# Patient Record
Sex: Female | Born: 1955 | Race: White | Hispanic: No | Smoking: Never smoker
Health system: Southern US, Community
[De-identification: ages and names within clinical notes are randomized; demographics above are authoritative.]

## PROBLEM LIST (undated history)

## (undated) DIAGNOSIS — I10 Essential (primary) hypertension: Secondary | ICD-10-CM

## (undated) DIAGNOSIS — L409 Psoriasis, unspecified: Secondary | ICD-10-CM

## (undated) DIAGNOSIS — E119 Type 2 diabetes mellitus without complications: Secondary | ICD-10-CM

## (undated) HISTORY — PX: ABDOMINAL HYSTERECTOMY: SHX81

## (undated) HISTORY — DX: Type 2 diabetes mellitus without complications: E11.9

## (undated) HISTORY — PX: TONSILLECTOMY: SUR1361

## (undated) HISTORY — PX: APPENDECTOMY: SHX54

## (undated) HISTORY — PX: EYE SURGERY: SHX253

## (undated) HISTORY — PX: TUBAL LIGATION: SHX77

---

## 1999-09-16 ENCOUNTER — Ambulatory Visit (HOSPITAL_COMMUNITY): Admission: RE | Admit: 1999-09-16 | Discharge: 1999-09-16 | Payer: Self-pay | Admitting: Cardiology

## 1999-11-09 ENCOUNTER — Encounter: Payer: Self-pay | Admitting: Internal Medicine

## 1999-11-09 ENCOUNTER — Encounter: Admission: RE | Admit: 1999-11-09 | Discharge: 1999-11-09 | Payer: Self-pay | Admitting: Internal Medicine

## 2006-05-24 ENCOUNTER — Ambulatory Visit (HOSPITAL_COMMUNITY): Admission: RE | Admit: 2006-05-24 | Discharge: 2006-05-24 | Payer: Self-pay | Admitting: Internal Medicine

## 2006-05-24 ENCOUNTER — Ambulatory Visit: Payer: Self-pay | Admitting: Internal Medicine

## 2006-10-13 ENCOUNTER — Ambulatory Visit: Payer: Self-pay | Admitting: Internal Medicine

## 2006-10-17 ENCOUNTER — Ambulatory Visit: Payer: Self-pay | Admitting: Internal Medicine

## 2006-10-17 LAB — CONVERTED CEMR LAB
Basophils Absolute: 0 10*3/uL (ref 0.0–0.1)
Basophils Relative: 0.5 % (ref 0.0–1.0)
Chol/HDL Ratio, serum: 5.2
Cholesterol: 152 mg/dL (ref 0–200)
Eosinophil percent: 1.9 % (ref 0.0–5.0)
Free T4: 0.8 ng/dL — ABNORMAL LOW (ref 0.9–1.8)
HCT: 37.1 % (ref 36.0–46.0)
HDL: 29.5 mg/dL — ABNORMAL LOW (ref >39.0)
Hemoglobin: 12.6 g/dL (ref 12.0–15.0)
Hgb A1c MFr Bld: 5 % (ref 4.6–6.0)
LDL Cholesterol: 107 mg/dL — ABNORMAL HIGH (ref 0–99)
Lymphocytes Relative: 27.5 % (ref 12.0–46.0)
MCHC: 34 g/dL (ref 30.0–36.0)
MCV: 92.3 fL (ref 78.0–100.0)
Monocytes Absolute: 0.3 10*3/uL (ref 0.2–0.7)
Monocytes Relative: 5.5 % (ref 3.0–11.0)
Neutro Abs: 3.7 10*3/uL (ref 1.4–7.7)
Neutrophils Relative %: 64.6 % (ref 43.0–77.0)
Platelets: 232 10*3/uL (ref 150–400)
RBC: 4.02 M/uL (ref 3.87–5.11)
RDW: 13.4 % (ref 11.5–14.6)
TSH: 3.15 microintl units/mL (ref 0.35–5.50)
Triglyceride fasting, serum: 79 mg/dL (ref 0–149)
VLDL: 16 mg/dL (ref 0–40)
WBC: 5.6 10*3/uL (ref 4.5–10.5)

## 2006-11-01 ENCOUNTER — Ambulatory Visit: Payer: Self-pay | Admitting: Internal Medicine

## 2007-08-16 ENCOUNTER — Ambulatory Visit: Payer: Self-pay | Admitting: Internal Medicine

## 2007-08-16 DIAGNOSIS — J45909 Unspecified asthma, uncomplicated: Secondary | ICD-10-CM | POA: Insufficient documentation

## 2007-08-17 ENCOUNTER — Telehealth (INDEPENDENT_AMBULATORY_CARE_PROVIDER_SITE_OTHER): Payer: Self-pay | Admitting: *Deleted

## 2007-08-17 ENCOUNTER — Ambulatory Visit: Payer: Self-pay | Admitting: Internal Medicine

## 2007-08-21 ENCOUNTER — Telehealth (INDEPENDENT_AMBULATORY_CARE_PROVIDER_SITE_OTHER): Payer: Self-pay | Admitting: *Deleted

## 2007-08-21 ENCOUNTER — Encounter (INDEPENDENT_AMBULATORY_CARE_PROVIDER_SITE_OTHER): Payer: Self-pay | Admitting: *Deleted

## 2007-08-24 ENCOUNTER — Ambulatory Visit: Payer: Self-pay | Admitting: Internal Medicine

## 2007-08-30 ENCOUNTER — Ambulatory Visit: Payer: Self-pay | Admitting: Internal Medicine

## 2007-08-31 ENCOUNTER — Telehealth: Payer: Self-pay | Admitting: Internal Medicine

## 2007-09-01 ENCOUNTER — Encounter: Payer: Self-pay | Admitting: Internal Medicine

## 2007-09-10 ENCOUNTER — Ambulatory Visit: Payer: Self-pay | Admitting: Critical Care Medicine

## 2007-09-10 LAB — CONVERTED CEMR LAB: IgE (Immunoglobulin E), Serum: 6.4 intl units/mL (ref 0.0–180.0)

## 2007-09-13 ENCOUNTER — Telehealth: Payer: Self-pay | Admitting: Internal Medicine

## 2007-09-19 ENCOUNTER — Encounter: Payer: Self-pay | Admitting: Critical Care Medicine

## 2007-09-19 ENCOUNTER — Ambulatory Visit: Admission: RE | Admit: 2007-09-19 | Discharge: 2007-09-19 | Payer: Self-pay | Admitting: Critical Care Medicine

## 2007-09-19 ENCOUNTER — Ambulatory Visit: Payer: Self-pay | Admitting: Critical Care Medicine

## 2007-09-27 ENCOUNTER — Ambulatory Visit: Payer: Self-pay | Admitting: Critical Care Medicine

## 2007-10-08 ENCOUNTER — Ambulatory Visit: Payer: Self-pay | Admitting: Critical Care Medicine

## 2007-10-22 DIAGNOSIS — J8410 Pulmonary fibrosis, unspecified: Secondary | ICD-10-CM | POA: Insufficient documentation

## 2007-11-30 ENCOUNTER — Ambulatory Visit: Payer: Self-pay | Admitting: Critical Care Medicine

## 2008-03-20 ENCOUNTER — Ambulatory Visit: Payer: Self-pay | Admitting: Internal Medicine

## 2008-10-07 ENCOUNTER — Ambulatory Visit: Payer: Self-pay | Admitting: Internal Medicine

## 2008-11-02 IMAGING — CR DG CHEST 2V
2 series · 2 of 2 positions shown · non-contrast
Comparison: 05/24/2006.

CLINICAL DATA: Fever. Pneumonia. Asthma. Cough. Nonsmoker.

CHEST - 2 VIEW

[view not recorded (1 of 2)]
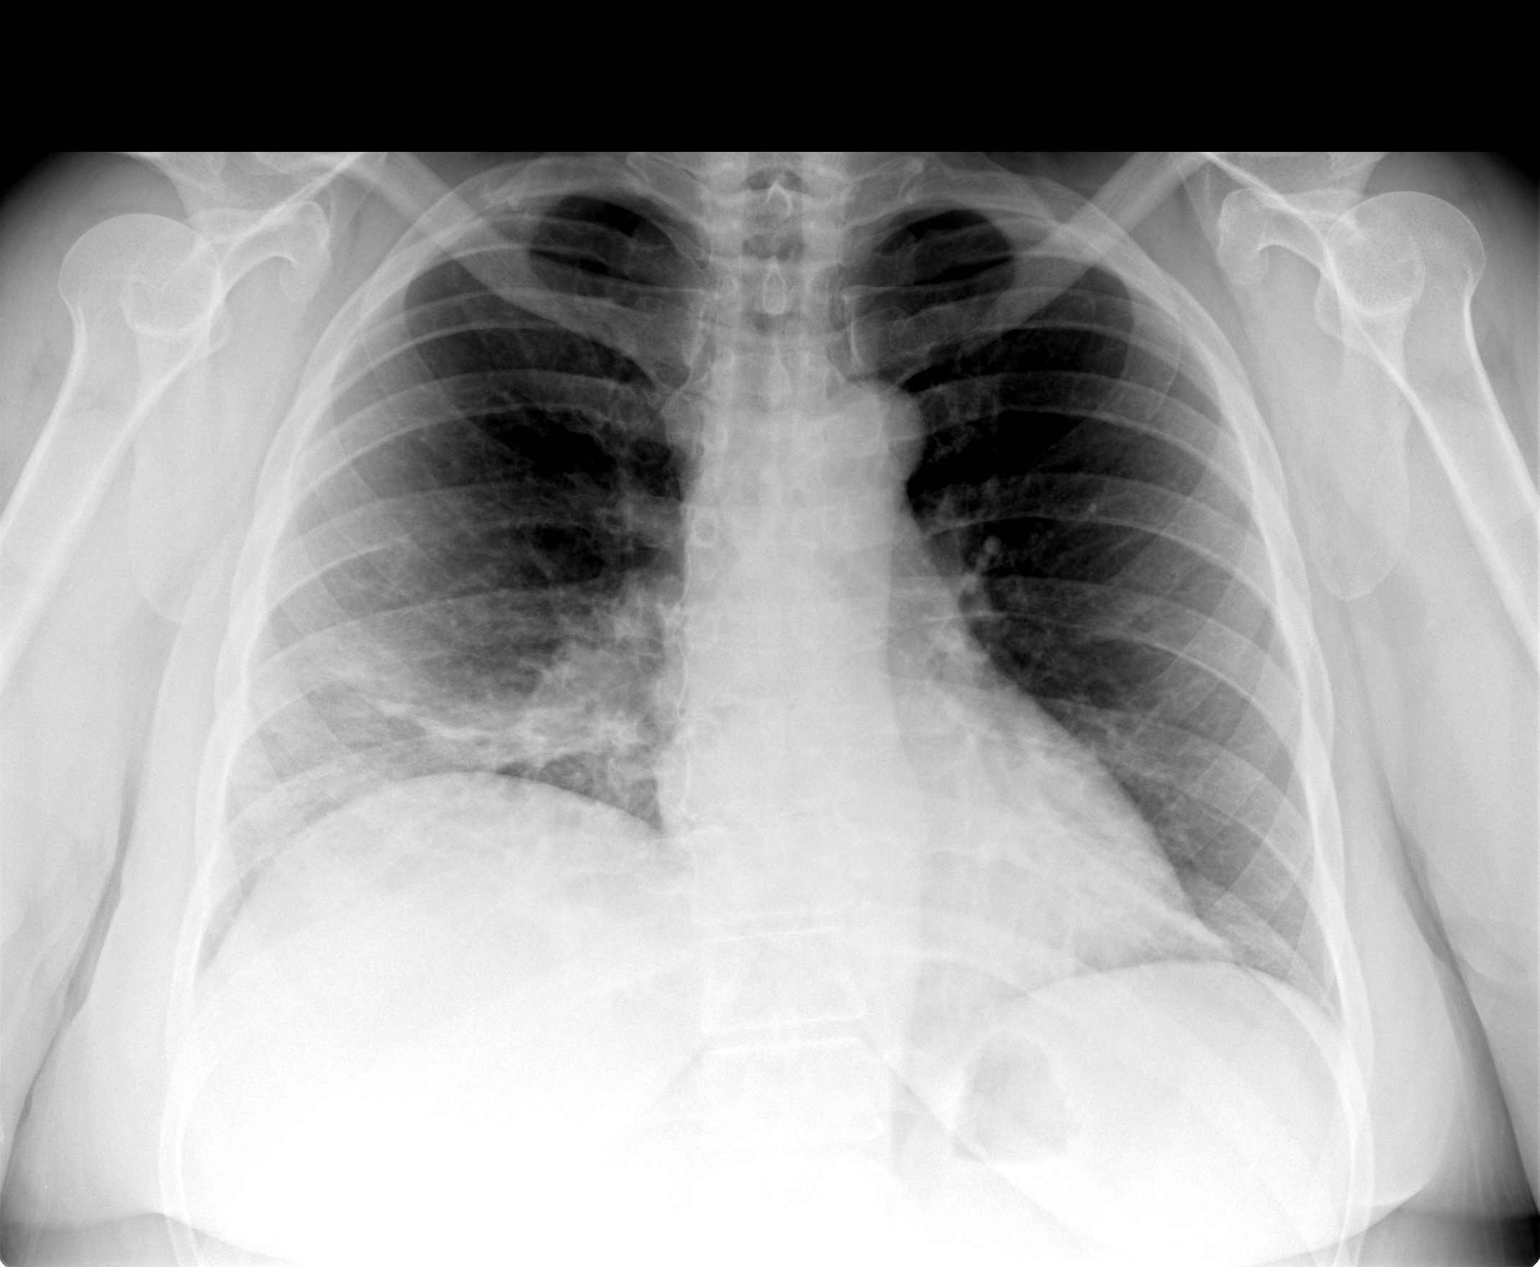

[view not recorded (2 of 2)]
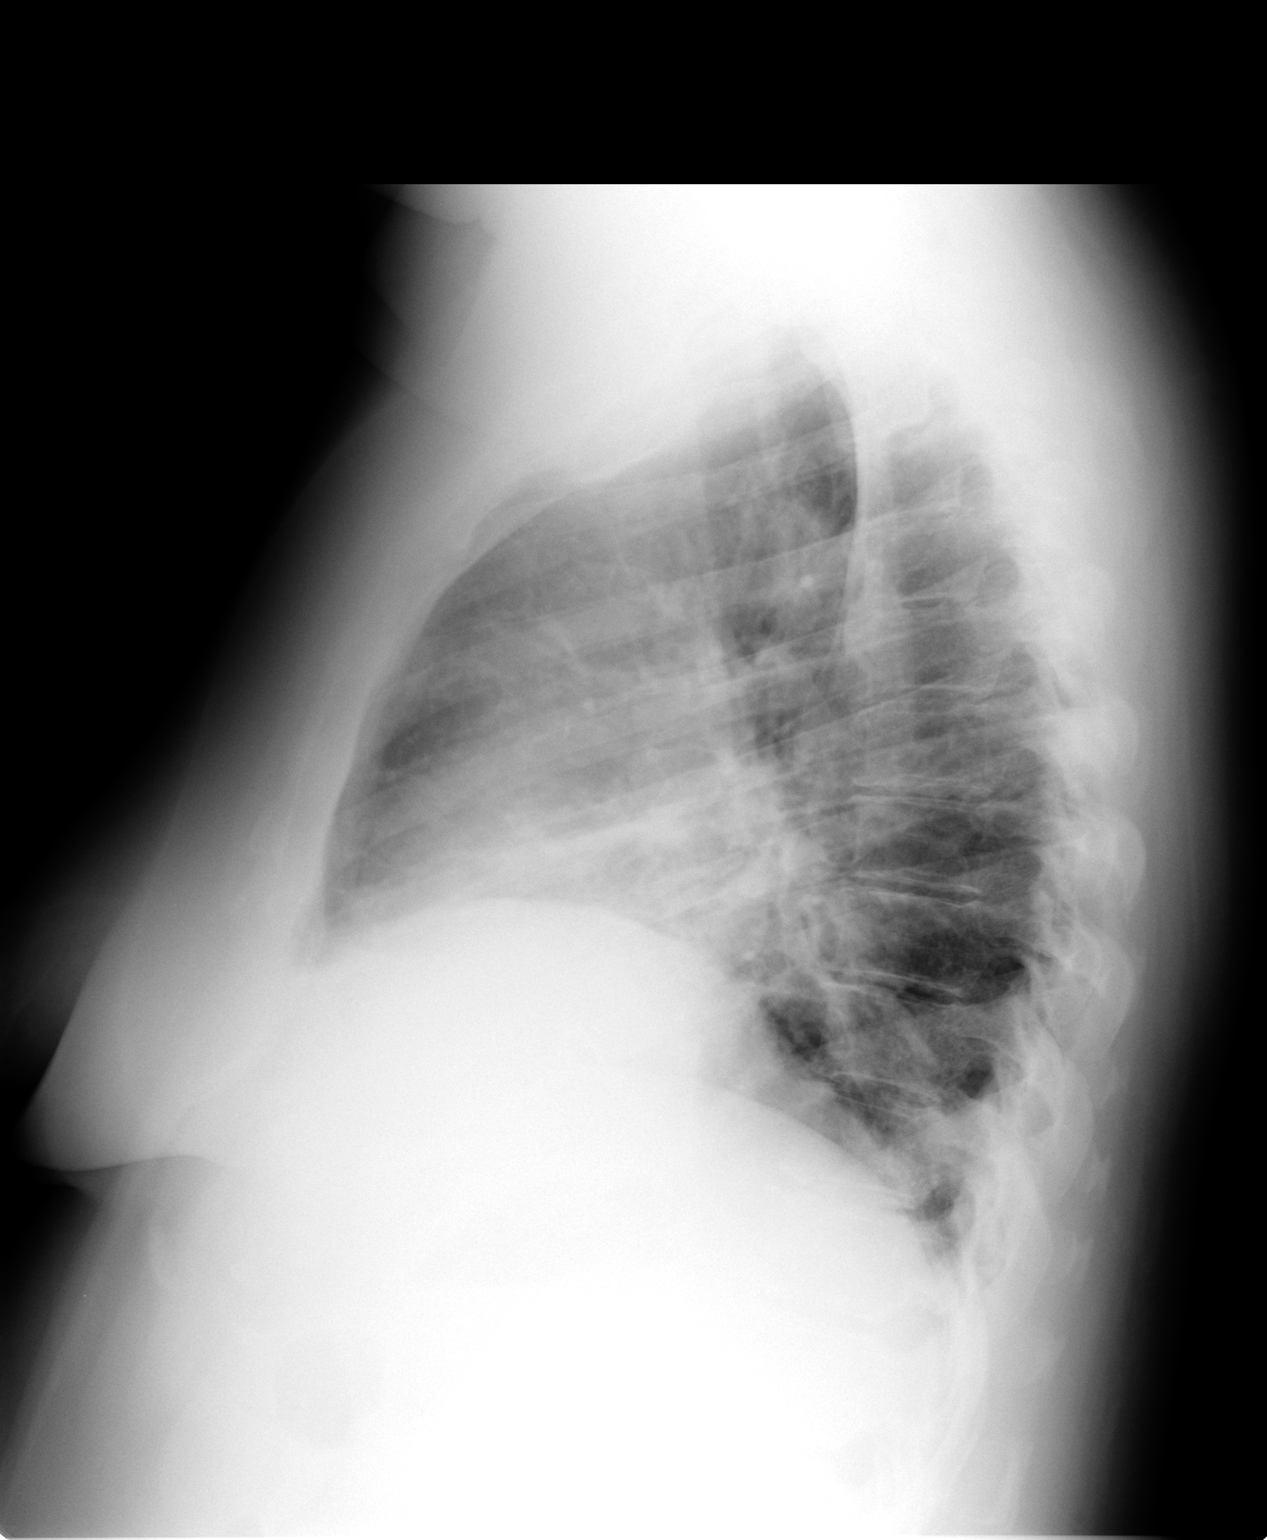

[2 of 2 positions shown; findings below may reference images not displayed]

FINDINGS: Midline trachea. Normal heart size. Right hilar soft tissue fullness.

Sharp costophrenic angles without pneumothorax. New volume loss and airspace
disease in the right infrahilar lung, likely right lower lobe. Left lung clear.

IMPRESSION

1. Right infrahilar volume loss and air space opacity with a suggestion of right
hilar soft tissue fullness. Although findings could relate to infection, a
underlying central right hilar lesion with postobstructive atelectasis and/or
pneumonitis is suspected. Further evaluation with chest CT should be considered.
Alternatively, patient could be treated for infection with short-term followup
radiographs in approximately one week performed.  I called and personally
discussed this report with Dr. Rtoyota , at [DATE] a.m. on 08/17/2007 .

## 2008-11-03 ENCOUNTER — Telehealth (INDEPENDENT_AMBULATORY_CARE_PROVIDER_SITE_OTHER): Payer: Self-pay | Admitting: *Deleted

## 2008-11-06 ENCOUNTER — Ambulatory Visit: Payer: Self-pay | Admitting: Internal Medicine

## 2008-11-06 DIAGNOSIS — G43009 Migraine without aura, not intractable, without status migrainosus: Secondary | ICD-10-CM | POA: Insufficient documentation

## 2008-11-06 DIAGNOSIS — I10 Essential (primary) hypertension: Secondary | ICD-10-CM | POA: Insufficient documentation

## 2008-11-06 DIAGNOSIS — F411 Generalized anxiety disorder: Secondary | ICD-10-CM | POA: Insufficient documentation

## 2011-01-18 NOTE — Assessment & Plan Note (Signed)
Summary: TRANSFER FROM DR. HOPPER/$50/SELF PAY/JSS   Vital Signs:  Patient Profile:   55 Years Old Female Height:     68 inches Weight:      281 pounds O2 Sat:      94 % O2 treatment:    Room Air Temp:     97.6 degrees F oral Pulse rate:   90 / minute BP sitting:   134 / 98  (left arm) Cuff size:   large  Vitals Entered By: Payton Spark CMA (November 06, 2008 9:51 AM)                  Chief Complaint:  new pt.  History of Present Illness: transferring care here she states b/c driving to [place] with dr hopper is too far; no insurance - she is self Engineer, materials for Dept of soc services -  BP mildly elevated today, has been that was recent after tylenol cold prep; was afraid to take the diltiazem per dr hopper b/c of the dire warnings she read on the info that comes with the med from the pharmacy; is instead on maxide for 4th day today and having some headache and palpitations that she does not normally  have with coffee; also with some dry cough for 1 wk - tx for bronchitis per pt approx 4 wks ago - tx with codein cough prep but not really taking; has been reading on the internet about BOOP since that was suggested at some time in the past and is still not sure what her problem was as diagnosed per dr Delford Field, now with having some trouble getting health insurance she says b/c "not sure of what her diganosis was"; cont's to have nervousness it seems with remarkably poor insight, No insurance at this time; also never took the gabapentin per dr hopper due to suicide warning on the label; used to take midrin and darvocet for migraine    Updated Prior Medication List: * EXCEDRIN  SYMBICORT 160-4.5 MCG/ACT  AERO (BUDESONIDE-FORMOTEROL FUMARATE) two puff two times a day [BMN] HYDROCHLOROTHIAZIDE 25 MG TABS (HYDROCHLOROTHIAZIDE) 1po once daily KLOR-CON 10 10 MEQ CR-TABS (POTASSIUM CHLORIDE) 1 by mouth once daily AMLODIPINE BESYLATE 5 MG TABS (AMLODIPINE BESYLATE) 1po once  daily MIDRIN 325-65-100 MG CAPS (APAP-ISOMETHEPTENE-DICHLORAL) 1po once daily as needed DARVOCET-N 100 100-650 MG TABS (PROPOXYPHENE N-APAP) 1 by mouth once daily as needed headache PREDNISONE 10 MG TABS (PREDNISONE) 3po qd for 3days, then 2po qd for 3days, then 1po qd for 3days, then stop PROAIR HFA 108 (90 BASE) MCG/ACT AERS (ALBUTEROL SULFATE) 2 puffs four times per day as needed  Current Allergies (reviewed today): ! PCN ! 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)       CHEST XRAY, ABNORMAL (ICD-793.1)       PNEUMONIA (ICD-486) 2008       Allergic rhinitis       Anxiety       migraine       Hypertension       DJD - bilat knees  Past Surgical History:    Reviewed history from 10/07/2008 and no changes required:       Cystoscopy  Appendectomy       Tonsillectomy       Hysterectomy       Bronchoscopy 2008, Dr Delford Field,  Bronchiolitis Obliterans post CAP , hypersensitivity airway inflammation       Tubal ligation       s/p left eye surgury 1975   Family History:    Reviewed history from 08/16/2007 and no changes required:       Father:BPH       Mother:asthma , COAD, DM        Siblings:bro DM,ETOH   Social History:    Reviewed history and no changes required:       former Manufacturing systems engineer       Divorced       2 children       work - self employed Pensions consultant  - member of [employer]       daughter is Librarian, academic       Never Smoked       Alcohol use-no   Risk Factors:  Tobacco use:  never Alcohol use:  no   Review of Systems       all otherwise negative    Physical Exam  General:     alert and overweight-appearing.   Head:     Normocephalic and atraumatic without obvious abnormalities. No apparent alopecia or  balding. Eyes:     No corneal or conjunctival inflammation noted. EOMI. Perrla.  Ears:     External ear exam shows no significant lesions or deformities.  Otoscopic examination reveals clear canals, tympanic membranes are intact bilaterally without bulging, retraction, inflammation or discharge. Hearing is grossly normal bilaterally. Nose:     External nasal examination shows no deformity or inflammation. Nasal mucosa are pink and moist without lesions or exudates. Mouth:     Oral mucosa and oropharynx without lesions or exudates.  Teeth in good repair. Neck:     No deformities, masses, or tenderness noted. Lungs:     Normal respiratory effort, chest expands symmetrically. Lungs are clear to auscultation, no crackles or wheezes. Heart:     Normal rate and regular rhythm. S1 and S2 normal without gallop, murmur, click, rub or other extra sounds. Abdomen:     Bowel sounds positive,abdomen soft and non-tender without masses, organomegaly or hernias noted. Msk:     no joint tenderness and no joint swelling.   Extremities:     no edema, no ulcers  Neurologic:     cranial nerves II-XII intact and strength normal in all extremities.   Psych:     severely anxious.      Impression & Recommendations:  Problem # 1:  ASTHMA (ICD-493.90) ? by hx more c/w chronic asthmatic bronchitis  - treat as above, f/u any worsening signs or symptoms , will refer back to pulm (dr Delford Field) even though she just transferred from dr hopper who is also pulmonologist) Her updated medication list for this problem includes:    Symbicort 160-4.5 Mcg/act Aero (Budesonide-formoterol fumarate) .Marland Kitchen..Marland Kitchen Two puff two times a day    Prednisone 10 Mg Tabs (Prednisone) .Marland Kitchen... 3po qd for 3days, then 2po qd for 3days, then 1po qd for 3days, then stop    Proair Hfa 108 (90 Base) Mcg/act Aers (Albuterol sulfate) .Marland Kitchen... 2 puffs four times per day as needed  Orders: T-2 View CXR, Same Day (71020.5TC) Pulmonary Referral  (Pulmonary)   Problem # 2:  HYPERTENSION (ICD-401.9)  Her updated medication list for this problem includes:  Hydrochlorothiazide 25 Mg Tabs (Hydrochlorothiazide) .Marland Kitchen... 1po once daily    Amlodipine Besylate 5 Mg Tabs (Amlodipine besylate) .Marland Kitchen... 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 1po once daily as needed    Darvocet-n 100 100-650 Mg Tabs (Propoxyphene n-apap) .Marland Kitchen... 1 by mouth once daily as needed headache treat as above, f/u any worsening signs or symptoms   Problem # 4:  ANXIETY (ICD-300.00) reasured today on multiple issues, she seems surprised and taken aback at my suggestion she may have more anxiety on a chronic basis   Complete Medication List: 1)  Excedrin  2)  Symbicort 160-4.5 Mcg/act Aero (Budesonide-formoterol fumarate) .... Two puff two times a day 3)  Hydrochlorothiazide 25 Mg Tabs (Hydrochlorothiazide) .Marland Kitchen.. 1po once daily 4)  Klor-con 10 10 Meq Cr-tabs (Potassium chloride) .Marland Kitchen.. 1 by mouth once daily 5)  Amlodipine Besylate 5 Mg Tabs (Amlodipine besylate) .Marland Kitchen.. 1po once daily 6)  Midrin 325-65-100 Mg Caps (Apap-isometheptene-dichloral) .Marland Kitchen.. 1po once daily as needed 7)  Darvocet-n 100 100-650 Mg Tabs (Propoxyphene n-apap) .Marland Kitchen.. 1 by mouth once daily as needed headache 8)  Prednisone 10 Mg Tabs (Prednisone) .... 3po qd for 3days, then 2po qd for 3days, then 1po qd for 3days, then stop 9)  Proair Hfa 108 (90  Base) Mcg/act Aers (Albuterol sulfate) .... 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   Prescriptions: PROAIR HFA 108 (90 BASE) MCG/ACT AERS (ALBUTEROL SULFATE) 2 puffs four times per day as needed  #1 x 11   Entered and Authorized by:   Corwin Levins MD   Signed by:   Corwin Levins MD on 11/06/2008   Method used:   Print then Give to Patient   RxID:   [id number] SYMBICORT 160-4.5 MCG/ACT  AERO (BUDESONIDE-FORMOTEROL FUMARATE) two puff two times a day Brand medically necessary #1 x 11   Entered and Authorized by:   Corwin Levins MD   Signed by:   Corwin Levins MD on 11/06/2008   Method used:   Print then Give to Patient   RxID:   [id number] PREDNISONE 10 MG TABS (PREDNISONE) 3po qd for 3days, then 2po qd for 3days, then 1po qd for 3days, then stop  #18 x 0   Entered and Authorized by:   Corwin Levins MD   Signed by:   Corwin Levins MD on 11/06/2008  Method used:   Print then Give to Patient   RxID:   [id number] DARVOCET-N 100 100-650 MG TABS (PROPOXYPHENE N-APAP) 1 by mouth once daily as needed headache  #30 x 1   Entered and Authorized by:   Corwin Levins MD   Signed by:   Corwin Levins MD on 11/06/2008   Method used:   Print then Give to Patient   RxID:   [id number] MIDRIN 325-65-100 MG CAPS (APAP-ISOMETHEPTENE-DICHLORAL) 1po once daily as needed  #30 x 1   Entered and Authorized by:   Corwin Levins MD   Signed by:   Corwin Levins MD on  11/06/2008   Method used:   Print then Give to Patient   RxID:   [id number] AMLODIPINE BESYLATE 5 MG TABS (AMLODIPINE BESYLATE) 1po once daily  #30 x 11   Entered and Authorized by:   Corwin Levins MD   Signed by:   Corwin Levins MD on 11/06/2008   Method used:   Print then Give to Patient   RxID:   [id number] KLOR-CON 10 10 MEQ CR-TABS (POTASSIUM CHLORIDE) 1 by mouth once daily  #30 x 11   Entered and Authorized by:   Corwin Levins MD   Signed by:   Corwin Levins MD on 11/06/2008   Method used:   Print then Give to Patient   RxID:   [id number] HYDROCHLOROTHIAZIDE 25 MG TABS (HYDROCHLOROTHIAZIDE) 1po once daily  #100 x 3   Entered and Authorized by:   Corwin Levins MD   Signed by:   Corwin Levins MD on 11/06/2008   Method used:   Print then Give to Patient   RxID:   [id number]  ]

## 2011-01-18 NOTE — Letter (Signed)
Summary: Results Follow up Letter  [facility]  [address] [place], [place] [address]   Phone: [phone]  Fax: [phone]    08/21/2007 MRN: [id number]  HARLA MENSCH [address] [place], [place]  [address]  Dear Ms. Pruiett,  The following are the results of your recent test(s):  Test         Result    Pap Smear:        Normal _____  Not Normal _____ Comments: ______________________________________________________ Cholesterol: LDL(Bad cholesterol):         Your goal is less than:         HDL (Good cholesterol):       Your goal is more than: Comments:  ______________________________________________________ Mammogram:        Normal _____  Not Normal _____ Comments:  ___________________________________________________________________ Hemoccult:        Normal _____  Not normal _______ Comments:    _____________________________________________________________________ Other Tests:  Please see attached results and comments   We routinely do not discuss normal results over the telephone.  If you desire a copy of the results, or you have any questions about this information we can discuss them at your next office visit.   Sincerely,

## 2011-01-18 NOTE — Miscellaneous (Signed)
Summary: Orders Update  Clinical Lists Changes  Orders: Added new Referral order of Pulmonary Referral (Pulmonary) - Signed 

## 2011-01-18 NOTE — Assessment & Plan Note (Signed)
Summary: 2 MONTHS FU///KWP   Chief Complaint:  no problems and asthma.  History of Present Illness:       This is a 55 years old female who presents with asthma.  The patient complains of history of diagnosed Asthma, chest tightness, chest pain, and nocturnal awakening, but denies cough, shortness of breath, wheezing, mucous production, and congestion.  Symptoms appear triggered by irritant:.  The patient also has the following associated problems: heartburn, sour taste in mouth, indigestion, rash, and non-productive cough.  Previous effective treatment includes ICS + LABA.    The patient also notes a rash over both feet and left hand.  The patient overall is less dyspneic.  She stopped symbicort three weeks ago as she could not affordt the medicine.  She is getting patient assistance.     Current Allergies: ! PCN ! 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  late 1990s   Family History:    Reviewed history from 08/16/2007 and no changes required:       Father:BPH       Mother:asthma , COAD,DM        Siblings:bro DM,ETOH    Risk Factors: Tobacco use:  never Alcohol use:  no   Review of Systems      See HPI for Pulmonary, Cardiac, General, and ENT review of systems.  General      Denies fever, chills, and sweats.  ENT      Denies nasal congestion, nosebleeds, difficulty swallowing, and post nasal drip.  CV      Denies swelling of hands or feet.   Vital Signs:  Patient Profile:   55 Years Old Female Height:     68 inches Weight:      294.6 pounds BMI:     44.96 O2 Sat:      95 % Temp:     99.1 degrees F oral Pulse rate:   81 / minute BP standing:   130 / 80  (left arm)  Pt. in pain?   no  Vitals Entered By: Clarise Cruz Duncan Dull) (November 30, 2007 4:14 PM) Oxygen therapy Room Air                  Physical Exam  General:     well developed, well  nourished, in no acute distress Head:     normocephalic and atraumatic Nose:     no deformity, discharge, inflammation, or lesions Mouth:     no deformity or lesions Neck:     no masses, thyromegaly, or abnormal cervical nodes Lungs:     clear bilaterally to auscultation and percussion Heart:     regular rate and rhythm, S1, S2 without murmurs, rubs, gallops, or clicks Abdomen:     bowel sounds positive; abdomen soft and non-tender without masses, or organomegaly Pulses:     pulses normal Extremities:     no clubbing, cyanosis, edema, or deformity noted Skin:     eczematous rash:.  over both dorsum of the feet; also on hands as well Cervical Nodes:     no significant adenopathy Axillary Nodes:     no significant adenopathy     Problem # 1:  ASTHMA (ICD-493.90) Assessment: Improved Stable despite off symbicort for three weeks.  plan: resume Symbicort and provide samples:  two puffs two times a day  Her updated medication list for this problem includes:    Symbicort 160-4.5 Mcg/act Aero (Budesonide-formoterol fumarate) .Marland Kitchen..Marland Kitchen Two puff two times a day   Problem # 2:  ECZEMA (ICD-692.9) Assessment: New Infected eczema of both feet and left hand  plan: Keflex 250mg  three times a day for 7days topical cortisone cream Her updated medication list for this problem includes:    Medi-cortisone 1 % Crea (Hydrocortisone acetate) .Marland Kitchen... 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 One by mouth three times a day   Medications Added to Medication List This Visit: 1)  Keflex 250 Mg Caps (Cephalexin) .... One by mouth three times a day 2)   Medi-cortisone 1 % Crea (Hydrocortisone acetate) .... Apply to affected areas three times a day  Complete Medication List: 1)  Extra Strength Tylenol  2)  Promethazine-codeine 6.25-10 Mg/38ml Syrp (Promethazine-codeine) .Marland Kitchen.. 1-2 tsp q 4-6 hrs as needed 3)  Symbicort 160-4.5 Mcg/act Aero (Budesonide-formoterol fumarate) .... Two puff two times a day 4)  Keflex 250 Mg Caps (Cephalexin) .... One by mouth three times a day 5)  Medi-cortisone 1 % Crea (Hydrocortisone acetate) .... Apply to affected areas three times a day   Patient Instructions: 1)  Please schedule a follow-up appointment in 2 months. 2)  Take Keflex for 7 days 250mg  three times a day  3)  Take topical steroid cream to affected areas three times a day    Prescriptions: SYMBICORT 160-4.5 MCG/ACT  AERO (BUDESONIDE-FORMOTEROL FUMARATE) two puff two times a day Brand medically necessary #1 x 6   Entered and Authorized by:   Storm Frisk MD   Signed by:   Storm Frisk MD on 11/30/2007   Method used:   Print then Give to Patient   RxID:   [id number] MEDI-CORTISONE 1 %  CREA (HYDROCORTISONE ACETATE) apply to affected areas three times a day  #30 gram tube x 0   Entered and Authorized by:   Storm Frisk MD   Signed by:   Storm Frisk MD on 11/30/2007   Method used:   Print then Give to Patient   RxID:   [id number] KEFLEX 250 MG  CAPS (CEPHALEXIN) one by mouth three times a day  #21 x 0   Entered and Authorized by:   Storm Frisk MD   Signed by:   Storm Frisk MD on 11/30/2007   Method used:   Print then Give to Patient   RxID:   [id number]  ]

## 2011-01-18 NOTE — Assessment & Plan Note (Signed)
Summary: acute only for bronitis//ph   Vital Signs:  Patient Profile:   55 Years Old Female Height:     68 inches Weight:      287.50 pounds Temp:     98.1 degrees F oral Resp:     17 per minute BP sitting:   150 / 110  Vitals Entered By: Kandice Hams (October 07, 2008 12:42 PM)                 Chief Complaint:  pt c/o cough with production started getting bad yesterday and Cough.  History of Present Illness: Onset as head congestion 10/03/08; productive cough as of 10/06/08. See BP ; she took Pseudoephrine  containing OTC Tylenol over weekend. PMH BP elevated in 2001; meds taken for HTN  which resolved with weight loss.  Cough      This is a 55 year old woman who presents with Cough.  The patient reports productive cough and pleuritic chest pain, but denies non-productive cough, shortness of breath, wheezing, exertional dyspnea, fever, hemoptysis, and malaise.  Associated symtpoms include cold/URI symptoms, sore throat, and nasal congestion.  The patient denies the following symptoms: chronic rhinitis, weight loss, acid reflux symptoms, and peripheral edema.  Ineffective prior treatments have included OTC cough medication.  Risk factors include recurrent sinus infections and history of allergic rhinitis. Dr Lynelle Doctor evaluation reviewed.   Hypertension History:      She complains of headache and side effects from treatment, but denies chest pain, palpitations, dyspnea with exertion, orthopnea, PND, peripheral edema, visual symptoms, neurologic problems, and syncope.  She notes the following problems with antihypertensive medication side effects: see HPI.  Further comments include: Some frontal ha with URI. PMH of Midrin for migraines.        Negative major cardiovascular risk factors include female age less than 38 years old and non-tobacco-user status.       Current Allergies: ! PCN ! IBUPROFEN  Past Medical History:    G 2P 2; Interstitial cystitis    Asthma ?  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      No aura  ENT      Complains of earache.      Pain L ear; PMH of rupture  Derm      Complains of changes in color of skin and rash.      Rx for eczema from Dr Jorja Loa  Neuro      Denies disturbances in coordination, numbness, poor balance, and tingling.      Migraines 10/13 & 10/02/08  which disrrupted work   Physical Exam  General:     well-nourished,in no acute distress; alert,appropriate and cooperative throughout examination Eyes:     No corneal or conjunctival inflammation noted. Marland Kitchen Perrla.  Ears:     R ear normal and L TM erythema.   Nose:     mucosal erythema.  Dry nose externally Mouth:     Oral mucosa and oropharynx without lesions or exudates.  Teeth in good repair. Lungs:  Normal respiratory effort, chest expands symmetrically. Lungs: bibasilar  crackles w/o  wheezes. Heart:     Normal rate and regular rhythm. S1 and S2 normal without gallop, murmur, click, rub or other extra sounds. Pulses:     R and L carotid,radial,dorsalis pedis and posterior tibial pulses are full and equal bilaterally Extremities:     No clubbing, cyanosis, edema Skin:     Eczema over hands Cervical Nodes:     No lymphadenopathy noted Axillary Nodes:     No palpable lymphadenopathy    Impression & Recommendations:  Problem # 1:  BRONCHITIS-ACUTE (ICD-466.0)  The following medications were removed from the medication list:    Promethazine-codeine 6.25-10 Mg/56ml Syrp (Promethazine-codeine) .Marland Kitchen... 1-2 tsp q 4-6 hrs as needed    Clarithromycin 500 Mg  Tb24 (Clarithromycin) .Marland Kitchen... 2 once daily with food  Her updated medication list for this problem includes:    Symbicort 160-4.5 Mcg/act Aero (Budesonide-formoterol fumarate) .Marland Kitchen..Marland Kitchen Two puff two times a day    Azithromycin 250 Mg Tabs (Azithromycin) .Marland Kitchen... As per pack   Problem # 2:  ELEVATED BLOOD PRESSURE WITHOUT DIAGNOSIS OF HYPERTENSION (ICD-796.2)  Her updated medication list for this problem includes:    Diltiazem Hcl 120 Mg Tabs (Diltiazem hcl) .Marland Kitchen... 1 once daily if bp averages > 130/85 on average   Problem # 3:  HEADACHE (ICD-784.0)  Complete Medication List: 1)  Excedrin  2)  Symbicort 160-4.5 Mcg/act Aero (Budesonide-formoterol fumarate) .... Two puff two times a day 3)  Azithromycin 250 Mg Tabs (Azithromycin) .... As per pack 4)  Gabapentin 100 Mg Caps (Gabapentin) .Marland Kitchen.. 1 every 8 hrs for migraines 5)  Diltiazem Hcl 120 Mg Tabs (Diltiazem hcl) .Marland Kitchen.. 1 once daily if bp averages > 130/85 on average  Hypertension Assessment/Plan:      The patient's hypertensive risk group is category A: No risk factors and no target organ damage.  Her calculated 10 year risk of coronary heart disease is 20 %.  Today's blood pressure is 150/110.     Patient Instructions: 1)  Check your Blood Pressure regularly. If it is above: 130/85 ON AVERAGE  you should start Ditiazem 120 mg once daily . Avoid decongestants; use a Neti pot once daily if congested.   Prescriptions: DILTIAZEM HCL 120 MG TABS (DILTIAZEM HCL) 1 once daily if BP averages > 130/85 ON AVERAGE  #30 x 5   Entered and Authorized by:   Marga Melnick MD   Signed by:   Marga Melnick MD on 10/07/2008   Method used:   Print then Give to Patient   RxID:   [id number] GABAPENTIN 100 MG CAPS (GABAPENTIN) 1 every 8 hrs for migraines  #30 x 1   Entered and Authorized by:   Marga Melnick MD   Signed by:   Marga Melnick MD on 10/07/2008   Method used:   Print then Give to Patient   RxID:   [id number] AZITHROMYCIN 250 MG TABS  (AZITHROMYCIN) as per pack  #1 pack. x 0   Entered and Authorized by:   Marga Melnick MD   Signed by:   Marga Melnick MD on 10/07/2008   Method used:   Print then Give to Patient   RxID:   [id number]  ]

## 2011-01-18 NOTE — Progress Notes (Signed)
----   Converted from flag ---- ---- 08/17/2007 12:55 PM, Wendall Stade wrote: I tried to call patient but the number I have is her law office, left a message to call me on tuesday  ---- 08/17/2007 12:55 PM, Gwen Pounds wrote: pt calling about her chest xray results. see they are posted but they have not been reviewed yet.  just fyi ------------------------------

## 2011-01-18 NOTE — Progress Notes (Signed)
Summary: med ?'s Olegario Messier /hop see  Phone Note Call from Patient   Caller: Patient Reason for Call: Acute Illness Summary of Call: dr. hopper [phone] pt is having a bronchi procedure done october 1st. she was given promethazine-codeine 6.25-10mg /22ml. she wanted to know if it would be okay to take this medication until her procedure. at night is when she wanted to take the medication because she is having trouble breathing at night.we did referr her to dr. Delford Field Initial call taken by: Charolette Child,  September 13, 2007 9:50 AM  Follow-up for Phone Call        spoke with pt said cough med given in aug, cough did go away, now coughing a little at night using cough syrup, did see dr Delford Field on 9/22, pt said experiencing more sob when taking a dep breath in with cough, pt does have a bronch sched 10/1 and another proc 10/9 informed pt to call dr Delford Field office in ref to increased sob, pt said she will she said she will not take the cough syrup the night before procedure, said med just helps her relax at night to sleep from cough Follow-up by: Kandice Hams,  September 13, 2007 12:08 PM  Additional Follow-up for Phone Call Additional follow up Details #1::        OK to take as needed cough med but must let Dr Delford Field know prior to Bronchoscopy  if SOB progressing Additional Follow-up by: Marga Melnick MD,  September 13, 2007 5:41 PM    Additional Follow-up for Phone Call Additional follow up Details #2::    Returned pts call---l/m  ..................................................................Marland KitchenDaine Gip  September 14, 2007 11:45 AM Follow-up by: Daine Gip,  September 14, 2007 11:45 AM  Additional Follow-up for Phone Call Additional follow up Details #3:: Details for Additional Follow-up Action Taken: pt aware of dr hoppers response and will contact dr Florene Route office if her sob gets worse Additional Follow-up by: Gwen Pounds,  September 14, 2007 12:52 PM

## 2011-01-18 NOTE — Assessment & Plan Note (Signed)
Summary: high fever,coughing//tl   Vital Signs:  Patient Profile:   55 Years Old Female Weight:      283.13 pounds O2 Sat:      92 % Temp:     103.9 degrees F oral Pulse rate:   136 / minute Pulse rhythm:   regular BP sitting:   128 / 64  (left arm) Cuff size:   large  Vitals Entered By: Wendall Stade (August 16, 2007 4:27 PM) Oxygen therapy Room Air                 Chief Complaint:  sick for 7-8 days.  History of Present Illness: short of breath, hurts around chest and lower abdomen, coughing dark green , gasping for air, having fever,chills and sweats. Taking Tylenol q 4-6 hrs as needed. No definite hx asthma ; but RAD with fumes. She couldn't leave court 8/27 for appt. PMH PCN & ibuprofen allergy.  Current Allergies (reviewed today): ! PCN ! IBUPROFEN Updated/Current Medications (including changes made in today's visit):  * EXTRA STRENGTH TYLENOL  PROMETHAZINE-CODEINE 6.25-10 MG/5ML  SYRP (PROMETHAZINE-CODEINE) 1-2 tsp q 4-6 hrs as needed   Past Medical History:    G 2P 2; Interstitial cystitis    Asthma ?  late 1990s  Past Surgical History:    Cystoscopy    Appendectomy    Tonsillectomy    Hysterectomy   Family History:    Father:BPH    Mother:asthma , COAD,DM     Siblings:bro DM,ETOH    Risk Factors:  Tobacco use:  never Alcohol use:  no   Review of Systems  General      See HPI      Complains of chills, fever, and sweats.  Eyes      Denies discharge, eye irritation, eye pain, red eye, and vision loss-1 eye.  ENT      Complains of ear discharge and hoarseness.      Denies nosebleeds, sinus pressure, and sore throat.  CV      Complains of difficulty breathing at night and difficulty breathing while lying down.      Denies bluish discoloration of lips or nails, palpitations, swelling of feet, and swelling of hands.  Resp      See HPI      Complains of chest discomfort, chest pain with inspiration, cough, pleuritic, shortness of breath,  sputum productive, and wheezing.      no significant hemoptysis; 2-3 Tbsp purulent sputum daily  GI      Denies abdominal pain, bloody stools, constipation, diarrhea, nausea, and vomiting.  GU      Denies discharge, dysuria, hematuria, and urinary frequency.      some cough incontinence  MS      diffuse myalgias & arthralgias, esp @ inguinal areas bilat  Derm      Complains of itching and rash.      rash on feet 8 mos; Rx: Lamisilo, Curell lotion  Neuro      Complains of headaches.      generalized  Endo      Complains of excessive thirst.      Denies cold intolerance, excessive hunger, excessive urination, heat intolerance, polyuria, and weight change.  Heme      Denies abnormal bruising and bleeding.   Physical Exam  General:     uncomfortable-appearing; intermittent rigor.   Eyes:     Sl scleral hemorrhage OD meially; with accommodation OS deviates laterally Ears:     External ear exam  shows no significant lesions or deformities.  Otoscopic examination reveals clear canals, tympanic membranes are intact bilaterally without bulging, retraction, inflammation or discharge. Hearing is grossly normal bilaterally. Nose:     dry , erythematous Mouth:     erythematous Neck:     No deformities, masses, or tenderness noted. Lungs:     Brassy cough, rhonchi. O2 sats 92 % Heart:     S4 gallop. Flow murmur   Abdomen:     Bowel sounds positive,abdomen soft and non-tender without masses, organomegaly or hernias noted. Protuberant Msk:     Mild crepitus knees w/o effusion Pulses:     R and L carotid,radial,femoral,dorsalis pedis and posterior tibial pulses are full and equal bilaterally Extremities:     No clubbing, cyanosis, edema, or deformity noted with normal full range of motion of all joints.   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 Also delayed care as noted isuggests  judgment poor.      Impression & Recommendations:  Problem # 1:  PNEUMONIA (ICD-486)  Problem # 2:  FEVER (ICD-780.6)  Problem # 3:  ASTHMA (ICD-493.90)  Complete Medication List: 1)  Extra Strength Tylenol  2)  Promethazine-codeine 6.25-10 Mg/15ml Syrp (Promethazine-codeine) .Marland Kitchen.. 1-2 tsp q 4-6 hrs as needed   Patient Instructions: 1)  Use Symbicort ( 2 puffs twice a day) & Avelox  ( 1 daily) samples as directed; to ER tonight if no better. Chest Xray @ [facility] in am .    Prescriptions: PROMETHAZINE-CODEINE 6.25-10 MG/5ML  SYRP (PROMETHAZINE-CODEINE) 1-2 tsp q 4-6 hrs as needed  #240 cc x 0   Entered and Authorized by:   Marga Melnick MD   Signed by:   Marga Melnick MD on 08/16/2007   Method used:   Print then Give to Patient   RxID:   [id number]   Appended Document: Orders Update    Clinical Lists Changes  Orders: Added new Test order of CXR- 2view (CXR) - Signed

## 2011-01-18 NOTE — Miscellaneous (Signed)
Summary: Orders Update   Clinical Lists Changes  Problems: Added new problem of CHEST XRAY, ABNORMAL (ICD-793.1) Orders: Added new Referral order of Radiology Referral (Radiology) - Signed 

## 2011-01-18 NOTE — Progress Notes (Signed)
Summary: ct results  Phone Note Call from Patient Call back at [facility] Phone [phone] Call back at Work Phone [phone]   Summary of Call: wants results of CT  Follow-up for Phone Call        see flags; I had requested she come in for consultation. I'll try to reach her by phone; will need Pulmonary consult, ? bronchoscopy/TBB. Follow-up by: Marga Melnick MD,  September 01, 2007 3:17 AM

## 2011-01-18 NOTE — Progress Notes (Signed)
Summary: several issues - dr hopper  Phone Note Call from Patient Call back at Home Phone [phone] Call back at [phone]   Caller: Patient Summary of Call: patient was given rx for bp she got it filled but she read insert & doesnt want to take -  she used to take  maxide & wondered if she can have rx for that - she also got rx for Neurontin & doesnt want to take that either   ---- patient has flu like symptoms - her breathing sounded heavy - i offered appt but she refused Initial call taken by: Okey Regal [place],  November 03, 2008 3:55 PM  Follow-up for Phone Call        PATIENT SAID SHE DIDNT LIKE THE WARNINGS ON DILTIZEM AND SHE DOESNT WANT TO TAKE. PATIENT HAD MAXIDE IN THE PAST, SHE COULDNT REMEMBER WHEN SHE WAS HERE. WOULD LIKE RX.  PATIENT WITH THE FOLLOWING SYMPTOMS: DRY COUGH, LUNGS HURT,CHILLS,FEVER AND SORE THROAT, HEAD CONGESTIONS. PATIENT SAID SHE IS UNABLE TO COME IN FOR AN OFFICE VISIT (NO HEALTH INSURANCE)  DR.HOPPER PLEASE ADVISE Follow-up by: Shonna Chock,  November 03, 2008 4:16 PM  Additional Follow-up for Phone Call Additional follow up Details #1::        SPOKE WITH PATIENT, AWARE RX FOR MAXIDE AND COUGH SYRUP FORWARDED TO PHARMACY. PATIENT AWARE SHE REALLY NEEDS OFFICE VISIT IF NO BETTER./Chrae Malloy  November 03, 2008 5:09 PM     New/Updated Medications: MAXZIDE-25 37.5-25 MG TABS (TRIAMTERENE-HCTZ) 1 by mouth once daily PROMETHAZINE-CODEINE 6.25-10 MG/5ML SYRP (PROMETHAZINE-CODEINE) 1 TSP EVERY 6 HOURS as needed   Prescriptions: PROMETHAZINE-CODEINE 6.25-10 MG/5ML SYRP (PROMETHAZINE-CODEINE) 1 TSP EVERY 6 HOURS as needed  #120CC x 0   Entered by:   Shonna Chock   Authorized by:   Marga Melnick MD   Signed by:   Shonna Chock on 11/03/2008   Method used:   Printed then faxed to ...       [facility]  [address]  [phone]* (retail)       [address]       [place], [place]  [address]       Ph: [phone] or [phone]  Fax: [phone]   RxID:   [id number] MAXZIDE-25 37.5-25 MG TABS (TRIAMTERENE-HCTZ) 1 by mouth once daily  #30 x 3   Entered by:   Shonna Chock   Authorized by:   Marga Melnick MD   Signed by:   Shonna Chock on 11/03/2008   Method used:   Electronically to        [facility]  [phone]* (retail)       [address]       [place], [place]  [address]       Ph: [phone] or [phone]       Fax: [phone]   RxID:   [id number]

## 2011-01-18 NOTE — Assessment & Plan Note (Signed)
Summary: congestion--acute only--tl   Vital Signs:  Patient Profile:   55 Years Old Female Height:     68 inches Weight:      290.50 pounds O2 Sat:      97 % O2 treatment:    Room Air Temp:     97.0 degrees F oral Pulse rate:   60 / minute Pulse rhythm:   regular Resp:     17 per minute BP sitting:   150 / 86  (left arm) Cuff size:   regular  Vitals Entered By: Wendall Stade (March 20, 2008 2:24 PM)                 Chief Complaint:  sick for 8 days and Cough.  History of Present Illness: sick for eight days feels short of breath coughing brown yellow and green mucus mostly in chest now started back on symbicort    Cough      This is a 55 year old woman who presents with Cough.  Minor pleutic character bilat. Sinus pain & purulence.  The patient reports productive cough, pleuritic chest pain, shortness of breath, wheezing, and exertional dyspnea, but denies non-productive cough, fever, hemoptysis, and malaise.  Associated symtpoms include nasal congestion.  The patient denies the following symptoms: cold/URI symptoms, sore throat, chronic rhinitis, weight loss, acid reflux symptoms, and peripheral edema.  Ineffective prior treatments have included other asthma medication. PMH A/B.Dr Delford Field performed bronchoscopy w/o definite diagnosis.      Current Allergies (reviewed today): ! PCN ! 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  Eyes      Denies discharge, eye pain, red eye, and vision loss-both eyes.  ENT      See HPI      Denies earache.  Derm      Complains of changes in color of skin and rash.      Rash on feet & palms for > 1 year; appt with Dr Sherryl Barters PA 4/16. Topical Eucerin, Tinactin, Lotrimin AF, & cortisone of no benefit   Physical Exam  General:     Well-developed,well-nourished,in no acute distress; alert,appropriate and cooperative throughout examination Eyes:     No corneal or conjunctival inflammation noted. EOMI but resting OS exoytopia with lateral deviation with accommodation. Perrla.  Vision grossly normal. Ears:     External ear exam shows no significant lesions or deformities.  Otoscopic examination reveals  clear canals, tympanic membranes are intact bilaterally without bulging, retraction, inflammation or discharge. Hearing is grossly normal bilaterally. Nose:     External nasal examination shows no deformity or inflammation. Nasal mucosa are pink and moist without lesions or exudates. Mouth:     Oral mucosa and oropharynx without lesions or exudates.  Teeth in good repair. Lungs:     Normal respiratory effort, chest expands symmetrically. Lungs are clear to auscultation, no crackles or wheezes.No increased WOB Pulses:     R and L radial,dorsalis pedis and posterior tibial pulses are full and equal bilaterally Skin:     Plaque like patterned  dermatitis of hands which balances.Inflammed dermatitis of feet, worse dorsum L foot with  scattered scabbing on foot Cervical Nodes:     No lymphadenopathy noted Axillary Nodes:     No palpable lymphadenopathy Psych:     normally interactive, good eye contact, and not anxious appearing.      Impression & Recommendations:  Problem # 1:  BRONCHITIS-ACUTE (ICD-466.0)  The  following medications were removed from the medication list:    Keflex 250 Mg Caps (Cephalexin) ..... One by mouth three times a day  Her updated medication list for this problem includes:    Promethazine-codeine 6.25-10 Mg/51ml Syrp (Promethazine-codeine) .Marland Kitchen... 1-2 tsp q 4-6 hrs as needed    Symbicort 160-4.5 Mcg/act Aero (Budesonide-formoterol fumarate) .Marland Kitchen..Marland Kitchen Two puff two times a day    Clarithromycin 500 Mg Tb24 (Clarithromycin) .Marland Kitchen... 2 once daily with food   Problem # 2:  SINUSITIS- ACUTE-NOS (ICD-461.9)  The following medications were removed from the medication list:    Keflex 250 Mg Caps (Cephalexin) ..... One by mouth three times a day  Her updated medication list for this problem includes:    Promethazine-codeine 6.25-10 Mg/5ml Syrp (Promethazine-codeine) .Marland Kitchen... 1-2 tsp q 4-6 hrs as needed    Clarithromycin 500 Mg Tb24 (Clarithromycin) .Marland Kitchen... 2 once daily with food   Problem # 3:  RASH-NONVESICULAR (ICD-782.1) R/O Leukoclastic Vasculitis Her updated medication list for this problem includes:    Medi-cortisone 1 % Crea (Hydrocortisone acetate) .Marland Kitchen... Apply to affected areas three times a day    Kenalog Aers (Triamcinolone acetonide) .Marland Kitchen... Apply two times a day to hands   Complete Medication List: 1)  Extra Strength Tylenol  2)  Promethazine-codeine 6.25-10 Mg/95ml Syrp (Promethazine-codeine) .Marland Kitchen.. 1-2 tsp q 4-6 hrs as needed 3)  Symbicort 160-4.5 Mcg/act Aero (Budesonide-formoterol fumarate) .... Two puff two times a day 4)  Medi-cortisone 1 % Crea (Hydrocortisone acetate) .... Apply to affected areas three times a day 5)  Clarithromycin 500 Mg Tb24 (Clarithromycin) .... 2 once daily with food 6)  Kenalog Aers (Triamcinolone acetonide) .... Apply two times a day to hands   Patient Instructions: 1)  Neti pot once daily for nasal congestion. Use sterile saline soaked gauze two times a day- three times a day to L foot    Prescriptions: KENALOG   AERS (TRIAMCINOLONE ACETONIDE)  apply two times a day to hands  #1 DISPENSER x 0   Entered and Authorized by:   Marga Melnick MD   Signed by:   Marga Melnick MD on 03/20/2008   Method used:   Print then Give to Patient   RxID:   [id number] CLARITHROMYCIN 500 MG  TB24 (CLARITHROMYCIN) 2 once daily with food  #20 x 0   Entered and Authorized by:   Marga Melnick MD   Signed by:   Marga Melnick MD on 03/20/2008   Method used:  Print then Give to Patient   RxID:   [id number]  ]

## 2011-01-18 NOTE — Progress Notes (Signed)
Summary: to dr hopper regarding Cynthia Kemp  Phone Note Outgoing Call   Call placed by: Olegario Messier bixby Call placed to: [name] Summary of Call: attempting to reach patient with xray results and to see how she is doing.  I am only getting voice mail and Dr. Alwyn Ren is aware.  I tried her cell phone number [phone] and it has been disconnected and her emergency number has been disconnected [phone] Initial call taken by: Wendall Stade,  August 21, 2007 10:44 AM         Appended Document: to dr hopper regarding Cynthia Kemp pt called and her number has been updated in idx but has not flowed over into emr as of yet.  [phone] is her home number the [phone] is her sons cell number.  she received your message about blowing up the balloons and she is also aware that she will be having a ct of the chest this week. [name] 9.09.08 @ 8:45

## 2011-05-03 NOTE — Op Note (Signed)
NAMESABRIN, DUNLEVY              ACCOUNT NO.:  [id number]   MEDICAL RECORD NO.:  [id number]          PATIENT TYPE:  AMB   LOCATION:  CARD                         FACILITY:  [facility]   PHYSICIAN:  Charlcie Cradle. Delford Field, MD, FCCPDATE OF BIRTH:  21-Nov-1956   DATE OF PROCEDURE:  09/19/2007  DATE OF DISCHARGE:                               OPERATIVE REPORT   CHIEF COMPLAINT:  Bilateral infiltrates, evaluate for cause.   OPERATOR:  Shan Levans, MD   ANESTHESIA:  1% Xylocaine local.   PREOPERATIVE MEDICATION:  Fentanyl 50 mcg, Versed 5 mg IV push.   PROCEDURE:  The Pentax video bronchoscope was introduced via the  oropharynx.  The upper airways were visualized and were unremarkable.  The entire tracheobronchial tree was visualized and revealed diffuse  tracheobronchitis without endobronchial lesions and attention was then  paid to the right lower lobe.  Bronchial washings were obtained.  Transbronchial biopsies times five were obtained.   COMPLICATIONS:  None.   IMPRESSION:  Diffuse bilateral ground-glass infiltrates on CT scanning,  evaluate for hypersensitivity pneumonitis.   RECOMMENDATIONS:  Follow up pathology and microbiology.      Charlcie Cradle Delford Field, MD, [facility]  Electronically Signed     PEW/MEDQ  D:  09/19/2007  T:  09/20/2007  Job:  [id number]   cc:   Titus Dubin. Alwyn Ren, MD,FACP,FCCP  [phone] [address] [place]  [place] [address]

## 2011-05-03 NOTE — Assessment & Plan Note (Signed)
 HEALTHCARE                             PULMONARY OFFICE NOTE   KOREA, SEVERS                     MRN:          [id number]  DATE:10/08/2007                            DOB:          [birth year]    Ms. Shambaugh is a 55 year old female with bilateral pneumonitis, organized  pneumonia, eosinophilia, asthmatic bronchitis with a recent community  acquired pneumonia.  She has finished her course of prednisone and  Avelox, she is now on Symbicort 2 sprays b.i.d. 160/4.5 with improvement  in her shortness of breath and cough.  Biopsies from her recent  bronchoscopy showed acute and chronic inflammation, no organisms seen on  gram stain.  Her RAST assay was negative, IgE levels normal at 6.4.  Pulmonary functions were reviewed and showed restrictive defect with  total lung capacity of 59% predicted, no obstructive defect and  reduction in diffusion capacity to 54% of predicted.  She states her  cough and dyspnea are improved compared to previous examination.   EXAMINATION:  Temperature 98, blood pressure 160/80, pulse 75,  saturation was 98% room air.  CHEST:  Showed distant breath sound with poor air flow.  CARDIAC:  Showed a regular rate and rhythm without S3, normal S1-S2.  ABDOMEN:  Soft, nontender.  EXTREMITIES:  Showed no edema or clubbing.  SKIN:  Clear.  NEUROLOGIC:  Intact.   IMPRESSION:  That of organized pneumonia with bronchiolitis obliterans,  hypersensitivity airway inflammation, recent asthmatic bronchitic flair.   PLAN:  Patient is to continue Symbicort 2 sprays b.i.d., she was  reinstructed as to its proper use.  She will return to this office in 2  months.     Charlcie Cradle Delford Field, MD, [facility]  Electronically Signed    PEW/MedQ  DD: 10/08/2007  DT: 10/09/2007  Job #: [id number]   cc:   Titus Dubin. Alwyn Ren, MD,FACP,FCCP

## 2011-05-03 NOTE — Assessment & Plan Note (Signed)
[facility]                             PULMONARY OFFICE NOTE   VALORA, NORELL                     MRN:          [id number]  DATE:09/21/2007                            DOB:          Dec 01, 1956    Ms. Paget's bronchoscopy results return and show bilateral pneumonitis,  organized pneumonia with eosinophilia.  Her cultures are showing gram-  positive cocci in pairs.  Identification to follow.  The patient appears  to have organized pneumonia with associated hypersensitivity.  Plan for  the patient is to receive prednisone 40 mg a day tapered down by 10 mg  over 4 days until off.  She will receive Avelox 400 mg daily x5 days,  and she will receive Symbicort 2 sprays b.i.d. 160/4.5, and we will see  the patient back in the next several weeks in the office.     Charlcie Cradle Delford Field, MD, [facility]  Electronically Signed    PEW/MedQ  DD: 09/21/2007  DT: 09/22/2007  Job #: [id number]   cc:   Titus Dubin. Alwyn Ren, MD,FACP,FCCP

## 2011-05-03 NOTE — Assessment & Plan Note (Signed)
Cynthia Kemp                             PULMONARY OFFICE NOTE   RAJNI, HOLSWORTH                     MRN:          [id number]  DATE:09/10/2007                            DOB:          1956-12-08    CHIEF COMPLAINT:  Evaluate dyspnea and abnormal chest x-ray.   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  She was  found to have a right lower lobe infiltrate on chest x-ray, treated with  Avelox for 10 days.  Her fever went away.  Her cough improved.  Cough  was initially productive of brown-red material that has cleared.  Repeat  chest x-ray, however, showed persistent infiltrates.  Followup CT scan  of the chest was done showing bilateral ground glass opacification and a  patchy distribution.  She still is short of breath.  She walks up a hill  or up stairs.  She walks slowly on level ground.  She is okay.  Her  cough is now dry.  She has no chest pain.  Does note a regular  heartbeat.  She is a lifelong, never smoker.  Denies any heartburn.  She  had some sick exposures in that she works as an Pensions consultant obtaining  consent from patients and has been in some nursing home environments.  The patient is referred now for further evaluation.   PAST MEDICAL HISTORY:  1. Hypertension in the past.  2. Chronic allergies.  3. 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      She is now out of this office.   PAST SURGICAL HISTORY:  1. Hysterectomy 1997.  2. Tubal ligation 1984.  3. Appendectomy 1965.  4. Tonsillectomy 1962.  5. Eye muscle surgery 1975.   ALLERGIES:  PENICILLIN, IBUPROFEN.   CURRENT MEDICATIONS:  None.   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   FAMILY HISTORY:  Mother had COPD.   REVIEW OF SYSTEMS:  Otherwise,  noncontributory.  There is no sore  throat, tooth or dental problems.  No headaches, nasal congestion,  sneezing, itching, headache, ear ache, anxiety, depression, hand or foot  swelling.   PHYSICAL EXAMINATION:  GENERAL:  This is an obese white female in no  distress.  VITAL SIGNS:  Temperature 98, blood pressure 160/98, pulse 73,  saturation was 97% on room air.  CHEST:  Few dry rales at the bases.  Poor distant air flow.  CARDIAC:  Regular rate and rhythm without S3.  Normal S1, S2.  ABDOMEN:  Soft, nontender.  EXTREMITIES:  No edema, clubbing or venous disease.  SKIN:  Clear.   LABORATORY DATA:  CT scan done recently on August 30, 2007, showed  right hilar and perihilar adenopathy.  There is bilateral nonspecific  ground glass attenuation in the lungs, nonspecific pneumonitis.  It is  queried.  There is hepatosplenomegaly noted with fatty infiltration of  the liver.  No pulmonary functions are available for review.   IMPRESSION:  That of bilateral ground glass opacification, rule out  hypersensitivity pneumonitis versus organized pneumonia bronchiolitis  obliterans.   RECOMMENDATIONS:  Pursue bronchoscopy.  Obtain a RAST assay and a  hypersensitivity assay and fungal immunodiffusion assay.  Obtain full  set of pulmonary function studies.  Once these results are available,  further recommendations would follow.     Charlcie Cradle Delford Field, MD, [facility]  Electronically Signed    PEW/MedQ  DD: 09/10/2007  DT: 09/11/2007  Job #: [id number]   cc:   Titus Dubin. Alwyn Ren, MD,FACP,FCCP

## 2011-09-29 LAB — FUNGUS CULTURE W SMEAR: Fungal Smear: NONE SEEN

## 2011-09-29 LAB — P CARINII SMEAR DFA: Pneumocystis carinii DFA: NEGATIVE

## 2011-09-29 LAB — AFB CULTURE WITH SMEAR (NOT AT ARMC): Acid Fast Smear: NONE SEEN

## 2011-09-29 LAB — CULTURE, RESPIRATORY W GRAM STAIN

## 2011-09-29 LAB — LEGIONELLA PROFILE(CULTURE+DFA/SMEAR): Legionella Antigen (DFA): NEGATIVE

## 2013-05-27 ENCOUNTER — Encounter (HOSPITAL_COMMUNITY): Payer: Self-pay | Admitting: Emergency Medicine

## 2013-05-27 ENCOUNTER — Inpatient Hospital Stay (HOSPITAL_COMMUNITY)
Admission: EM | Admit: 2013-05-27 | Discharge: 2013-05-28 | DRG: 292 | Disposition: A | Discharge status: Home / Self Care | Payer: MEDICAID | Attending: Internal Medicine | Admitting: Internal Medicine

## 2013-05-27 ENCOUNTER — Emergency Department (HOSPITAL_COMMUNITY)
Admission: EM | Admit: 2013-05-27 | Discharge: 2013-05-27 | Disposition: A | Discharge status: Nursing Home (Includes ICF) | Payer: Self-pay | Source: Home / Self Care

## 2013-05-27 ENCOUNTER — Emergency Department (HOSPITAL_COMMUNITY): Payer: Self-pay

## 2013-05-27 DIAGNOSIS — R930 Abnormal findings on diagnostic imaging of skull and head, not elsewhere classified: Secondary | ICD-10-CM

## 2013-05-27 DIAGNOSIS — Z6841 Body Mass Index (BMI) 40.0 and over, adult: Secondary | ICD-10-CM

## 2013-05-27 DIAGNOSIS — I5030 Unspecified diastolic (congestive) heart failure: Secondary | ICD-10-CM | POA: Diagnosis present

## 2013-05-27 DIAGNOSIS — R040 Epistaxis: Secondary | ICD-10-CM | POA: Diagnosis present

## 2013-05-27 DIAGNOSIS — E669 Obesity, unspecified: Secondary | ICD-10-CM | POA: Diagnosis present

## 2013-05-27 DIAGNOSIS — J8410 Pulmonary fibrosis, unspecified: Secondary | ICD-10-CM

## 2013-05-27 DIAGNOSIS — F411 Generalized anxiety disorder: Secondary | ICD-10-CM

## 2013-05-27 DIAGNOSIS — L259 Unspecified contact dermatitis, unspecified cause: Secondary | ICD-10-CM

## 2013-05-27 DIAGNOSIS — L408 Other psoriasis: Secondary | ICD-10-CM | POA: Diagnosis present

## 2013-05-27 DIAGNOSIS — I110 Hypertensive heart disease with heart failure: Principal | ICD-10-CM | POA: Diagnosis present

## 2013-05-27 DIAGNOSIS — Z872 Personal history of diseases of the skin and subcutaneous tissue: Secondary | ICD-10-CM

## 2013-05-27 DIAGNOSIS — Z886 Allergy status to analgesic agent status: Secondary | ICD-10-CM

## 2013-05-27 DIAGNOSIS — Z880 Allergy status to penicillin: Secondary | ICD-10-CM

## 2013-05-27 DIAGNOSIS — R21 Rash and other nonspecific skin eruption: Secondary | ICD-10-CM

## 2013-05-27 DIAGNOSIS — I509 Heart failure, unspecified: Secondary | ICD-10-CM | POA: Diagnosis present

## 2013-05-27 DIAGNOSIS — J309 Allergic rhinitis, unspecified: Secondary | ICD-10-CM

## 2013-05-27 DIAGNOSIS — I10 Essential (primary) hypertension: Secondary | ICD-10-CM

## 2013-05-27 DIAGNOSIS — J45909 Unspecified asthma, uncomplicated: Secondary | ICD-10-CM

## 2013-05-27 DIAGNOSIS — J189 Pneumonia, unspecified organism: Secondary | ICD-10-CM

## 2013-05-27 DIAGNOSIS — R51 Headache: Secondary | ICD-10-CM | POA: Diagnosis present

## 2013-05-27 DIAGNOSIS — I160 Hypertensive urgency: Secondary | ICD-10-CM

## 2013-05-27 DIAGNOSIS — G43009 Migraine without aura, not intractable, without status migrainosus: Secondary | ICD-10-CM

## 2013-05-27 HISTORY — DX: Psoriasis, unspecified: L40.9

## 2013-05-27 HISTORY — DX: Essential (primary) hypertension: I10

## 2013-05-27 LAB — CBC WITH DIFFERENTIAL/PLATELET
Basophils Absolute: 0 10*3/uL (ref 0.0–0.1)
Basophils Relative: 0 % (ref 0–1)
Eosinophils Absolute: 0.1 10*3/uL (ref 0.0–0.7)
Eosinophils Relative: 1 % (ref 0–5)
HCT: 37.9 % (ref 36.0–46.0)
Hemoglobin: 13 g/dL (ref 12.0–15.0)
Lymphocytes Relative: 21 % (ref 12–46)
Lymphs Abs: 1.7 10*3/uL (ref 0.7–4.0)
MCH: 28.7 pg (ref 26.0–34.0)
MCHC: 34.3 g/dL (ref 30.0–36.0)
MCV: 83.7 fL (ref 78.0–100.0)
Monocytes Absolute: 0.4 10*3/uL (ref 0.1–1.0)
Monocytes Relative: 5 % (ref 3–12)
Neutro Abs: 5.8 10*3/uL (ref 1.7–7.7)
Neutrophils Relative %: 72 % (ref 43–77)
Platelets: 213 10*3/uL (ref 150–400)
RBC: 4.53 MIL/uL (ref 3.87–5.11)
RDW: 14.5 % (ref 11.5–15.5)
WBC: 8 10*3/uL (ref 4.0–10.5)

## 2013-05-27 LAB — COMPREHENSIVE METABOLIC PANEL
ALT: 25 U/L (ref 0–35)
AST: 50 U/L — ABNORMAL HIGH (ref 0–37)
Albumin: 4 g/dL (ref 3.5–5.2)
Alkaline Phosphatase: 113 U/L (ref 39–117)
BUN: 16 mg/dL (ref 6–23)
CO2: 27 mEq/L (ref 19–32)
Calcium: 9.5 mg/dL (ref 8.4–10.5)
Chloride: 99 mEq/L (ref 96–112)
Creatinine, Ser: 0.68 mg/dL (ref 0.50–1.10)
GFR calc Af Amer: >90 mL/min (ref >90)
GFR calc non Af Amer: >90 mL/min (ref >90)
Glucose, Bld: 146 mg/dL — ABNORMAL HIGH (ref 70–99)
Potassium: 4.1 mEq/L (ref 3.5–5.1)
Sodium: 135 mEq/L (ref 135–145)
Total Bilirubin: 0.7 mg/dL (ref 0.3–1.2)
Total Protein: 7.8 g/dL (ref 6.0–8.3)

## 2013-05-27 LAB — URINALYSIS, ROUTINE W REFLEX MICROSCOPIC
Bilirubin Urine: NEGATIVE
Glucose, UA: NEGATIVE mg/dL
Hgb urine dipstick: NEGATIVE
Ketones, ur: NEGATIVE mg/dL
Leukocytes, UA: NEGATIVE
Nitrite: NEGATIVE
Protein, ur: NEGATIVE mg/dL
Specific Gravity, Urine: 1.012 (ref 1.005–1.030)
Urobilinogen, UA: 0.2 mg/dL (ref 0.0–1.0)
pH: 7 (ref 5.0–8.0)

## 2013-05-27 LAB — TROPONIN I: Troponin I: <0.3 ng/mL (ref <0.30)

## 2013-05-27 LAB — PRO B NATRIURETIC PEPTIDE: Pro B Natriuretic peptide (BNP): 457.5 pg/mL — ABNORMAL HIGH (ref 0–125)

## 2013-05-27 MED ORDER — HYDROCHLOROTHIAZIDE 25 MG PO TABS: 25.0000 mg | ORAL_TABLET | Freq: Every day | ORAL | Status: DC

## 2013-05-27 MED ORDER — LABETALOL HCL 5 MG/ML IV SOLN
20.0000 mg | INTRAVENOUS | Status: DC | PRN
  Administered 2013-05-28: 20 mg via INTRAVENOUS
  Filled 2013-05-27: qty 4

## 2013-05-27 MED ORDER — NICARDIPINE HCL IN NACL 20-0.86 MG/200ML-% IV SOLN
5.0000 mg/h | Freq: Once | INTRAVENOUS | Status: AC
  Administered 2013-05-27: 5 mg/h via INTRAVENOUS
  Filled 2013-05-27: qty 200

## 2013-05-27 NOTE — ED Notes (Signed)
PT. REPORTS HYPERTENSION FOR SEVERAL WEEKS WITH INTERMITTENT HEADACHE , DENIES SOB OR CHEST DISCOMFORT , NO MEDS FOR HYPERTENSION AT THIS TIME.

## 2013-05-27 NOTE — ED Notes (Signed)
Patient changed her mind and agreed to go to [facility] for further evaluation.  Notified david mabe, np of patient decision.

## 2013-05-27 NOTE — ED Provider Notes (Signed)
Medical screening examination/treatment/procedure(s) were performed by non-physician practitioner and as supervising physician I was immediately available for consultation/collaboration.  COLL,PAOLO   Paolo Coll, MD 05/27/13 1949 

## 2013-05-27 NOTE — ED Provider Notes (Signed)
History     CSN: [id number]  Arrival date & time 05/27/13  1703   None     No chief complaint on file.   (Consider location/radiation/quality/duration/timing/severity/associated sxs/prior treatment) HPI Comments: 57 year old morbidly obese female has a history of hypertension for several years. She was originally old antihypertensives but for 10 years ago, but after losing weight she stopped her blood pressure medicines as her pressure came down. Over the past several weeks she has not been feeling well and took her blood pressure at the drug store and it was high. She is complaining of mild intermittent headaches and nosebleeds twice. Denies chest pain, heaviness, tightness, pressure, shortness of breath, cough, fever, chills or GI symptoms.   No past medical history on file.  No past surgical history on file.  No family history on file.  History  Substance Use Topics  . Smoking status: Not on file  . Smokeless tobacco: Not on file  . Alcohol Use: Not on file    OB History   No data available      Review of Systems  Constitutional: Negative for fever.       Malaise, tiredness  HENT: Positive for nosebleeds. Negative for sore throat, neck pain and neck stiffness.   Eyes: Negative for pain.  Respiratory: Negative.   Cardiovascular: Negative for chest pain, palpitations and leg swelling.  Gastrointestinal: Negative.   Skin: Negative.   Neurological: Negative.   Hematological: Negative.   Psychiatric/Behavioral: Negative.     Allergies  Ibuprofen and Penicillins  Home Medications  No current outpatient prescriptions on file.  BP 191/139  Pulse 95  Temp(Src) 97.9 F (36.6 C) (Oral)  Resp 24  SpO2 98%  Physical Exam  Nursing note and vitals reviewed. Constitutional: She is oriented to person, place, and time. She appears well-developed and well-nourished. No distress.  HENT:  Head: Normocephalic and atraumatic.  Eyes: EOM are normal. Pupils are equal,  round, and reactive to light.  Neck: Normal range of motion. Neck supple.  Cardiovascular: Normal rate, regular rhythm and normal heart sounds.   Pulmonary/Chest: Effort normal and breath sounds normal. No respiratory distress.  Musculoskeletal: Normal range of motion. She exhibits no edema and no tenderness.  Neurological: She is alert and oriented to person, place, and time. No cranial nerve deficit.  Skin: Skin is warm and dry.  Psychiatric: She has a normal mood and affect.    ED Course  Procedures (including critical care time)  Labs Reviewed - No data to display No results found.   No diagnosis found.    MDM  Have recommended pt go to the ED for monitoring and pharmacologic BP reduction. Also advised potential consequences of taking prescribed antihypertensive without monitoring. The blood pressure may not decrease as expected. He may decrease to rapidly. These can lead to stroke, heart attack or other problems. The patient has declined to the emergency department due to having to pay the bill. Is instructed to emergency department for any signs or symptoms of problems associated with the heart or stroke. These were explained to her in detail. She will sign an AMA. Given a prescription for HCTZ 25 mg by mouth daily. Follow up with the PCP established with on the 24th as directed.       And  Hayden Rasmussen, NP 05/27/13 1849

## 2013-05-27 NOTE — ED Notes (Signed)
Hypertension.  Distant history of htn.  Reports headaches for months, nose bleed 05/26/13.  Recorded blood pressure at drug store and noted bp to be high.

## 2013-05-27 NOTE — ED Provider Notes (Signed)
History     CSN: [id number]  Arrival date & time 05/27/13  1909   First MD Initiated Contact with Patient 05/27/13 2015      Chief Complaint  Patient presents with  . Hypertension    (Consider location/radiation/quality/duration/timing/severity/associated sxs/prior treatment) HPI Pt comes [facility] for severe HTN with periodic HA and nosebleeds. Pt states she was diagnosed with HTN > 10 years ago but lost weight and has been off BP meds since. Pt admits to increasing DOE and orthopnea. +lower ext edema. No chest pain. No current headaches. No vision changes or focal weakness.  Past Medical History  Diagnosis Date  . Hypertension   . Psoriasis     Past Surgical History  Procedure Laterality Date  . Abdominal hysterectomy    . Tonsillectomy    . Appendectomy      No family history on file.  History  Substance Use Topics  . Smoking status: Never Smoker   . Smokeless tobacco: Not on file  . Alcohol Use: No    OB History   Grav Para Term Preterm Abortions TAB SAB Ect Mult Living                  Review of Systems  Constitutional: Positive for fatigue. Negative for fever and chills.  HENT: Positive for nosebleeds. Negative for neck pain and neck stiffness.   Eyes: Negative for visual disturbance.  Respiratory: Positive for shortness of breath. Negative for cough, chest tightness and wheezing.   Cardiovascular: Positive for leg swelling. Negative for chest pain and palpitations.  Gastrointestinal: Negative for nausea, vomiting and abdominal pain.  Genitourinary: Negative for dysuria.  Musculoskeletal: Negative for myalgias and back pain.  Skin: Negative for rash and wound.  Neurological: Positive for headaches. Negative for dizziness, syncope, weakness, light-headedness and numbness.  All other systems reviewed and are negative.    Allergies  Ibuprofen and Penicillins  Home Medications   Current Outpatient Rx  Name  Route  Sig  Dispense  Refill  .  aspirin-acetaminophen-caffeine (EXCEDRIN MIGRAINE) 250-250-65 MG per tablet   Oral   Take 1 tablet by mouth every 6 (six) hours as needed for pain.         . hydrochlorothiazide (HYDRODIURIL) 25 MG tablet   Oral   Take 1 tablet (25 mg total) by mouth daily.   20 tablet   0     BP 163/83  Pulse 114  Temp(Src) 98.2 F (36.8 C) (Oral)  Resp 20  SpO2 98%  LMP 05/27/2013  Physical Exam  Nursing note and vitals reviewed. Constitutional: She is oriented to person, place, and time. She appears well-developed and well-nourished. No distress.  Obese   HENT:  Head: Normocephalic and atraumatic.  Mouth/Throat: Oropharynx is clear and moist.  Eyes: EOM are normal. Pupils are equal, round, and reactive to light.  Neck: Normal range of motion. Neck supple.  Cardiovascular: Normal rate and regular rhythm.  Exam reveals no gallop and no friction rub.   No murmur heard. Pulmonary/Chest: Effort normal and breath sounds normal. No respiratory distress. She has no wheezes. She has no rales. She exhibits no tenderness.  Abdominal: Soft. Bowel sounds are normal. She exhibits no distension and no mass. There is no tenderness. There is no rebound and no guarding.  Musculoskeletal: Normal range of motion. She exhibits edema (2+ bl pitting edema). She exhibits no tenderness.  Neurological: She is alert and oriented to person, place, and time.  5/5 motor in all ext, sensation intact  Skin: Skin is warm and dry. No rash noted. No erythema.  Psychiatric: She has a normal mood and affect. Her behavior is normal.    ED Course  Procedures (including critical care time)  Labs Reviewed  COMPREHENSIVE METABOLIC PANEL - Abnormal; Notable for the following:    Glucose, Bld 146 (*)    AST 50 (*)    All other components within normal limits  PRO B NATRIURETIC PEPTIDE - Abnormal; Notable for the following:    Pro B Natriuretic peptide (BNP) 457.5 (*)    All other components within normal limits  CBC  WITH DIFFERENTIAL  URINALYSIS, ROUTINE W REFLEX MICROSCOPIC  TROPONIN I   No results found.   1. Hypertensive urgency      Date: 05/27/2013  Rate: 111  Rhythm: sinus tachycardia  QRS Axis: normal  Intervals: normal  ST/T Wave abnormalities: nonspecific T wave changes  Conduction Disutrbances:none  Narrative Interpretation:   Old EKG Reviewed: none available    MDM   Pt BP improved with IV Cardene. Will change to PRN IV labetalol. [facility] to see in ED to eval for possible need for ICU bed.       Loren Racer, MD 05/27/13 [phone]

## 2013-05-28 ENCOUNTER — Encounter (HOSPITAL_COMMUNITY): Payer: Self-pay | Admitting: Internal Medicine

## 2013-05-28 DIAGNOSIS — I10 Essential (primary) hypertension: Secondary | ICD-10-CM

## 2013-05-28 DIAGNOSIS — F411 Generalized anxiety disorder: Secondary | ICD-10-CM

## 2013-05-28 DIAGNOSIS — I428 Other cardiomyopathies: Secondary | ICD-10-CM

## 2013-05-28 DIAGNOSIS — R930 Abnormal findings on diagnostic imaging of skull and head, not elsewhere classified: Secondary | ICD-10-CM

## 2013-05-28 DIAGNOSIS — Z872 Personal history of diseases of the skin and subcutaneous tissue: Secondary | ICD-10-CM

## 2013-05-28 DIAGNOSIS — J45909 Unspecified asthma, uncomplicated: Secondary | ICD-10-CM

## 2013-05-28 DIAGNOSIS — R51 Headache: Secondary | ICD-10-CM

## 2013-05-28 LAB — BASIC METABOLIC PANEL
BUN: 15 mg/dL (ref 6–23)
CO2: 28 mEq/L (ref 19–32)
Calcium: 9.3 mg/dL (ref 8.4–10.5)
Chloride: 96 mEq/L (ref 96–112)
Creatinine, Ser: 0.7 mg/dL (ref 0.50–1.10)
GFR calc Af Amer: >90 mL/min (ref >90)
GFR calc non Af Amer: >90 mL/min (ref >90)
Glucose, Bld: 253 mg/dL — ABNORMAL HIGH (ref 70–99)
Potassium: 3.9 mEq/L (ref 3.5–5.1)
Sodium: 133 mEq/L — ABNORMAL LOW (ref 135–145)

## 2013-05-28 LAB — CBC
HCT: 37.1 % (ref 36.0–46.0)
Hemoglobin: 12.5 g/dL (ref 12.0–15.0)
MCH: 28.4 pg (ref 26.0–34.0)
MCHC: 33.7 g/dL (ref 30.0–36.0)
MCV: 84.3 fL (ref 78.0–100.0)
Platelets: 215 10*3/uL (ref 150–400)
RBC: 4.4 MIL/uL (ref 3.87–5.11)
RDW: 14.7 % (ref 11.5–15.5)
WBC: 8.7 10*3/uL (ref 4.0–10.5)

## 2013-05-28 MED ORDER — ACETAMINOPHEN 650 MG RE SUPP: 650.0000 mg | Freq: Four times a day (QID) | RECTAL | Status: DC | PRN

## 2013-05-28 MED ORDER — ACETAMINOPHEN 325 MG PO TABS
650.0000 mg | ORAL_TABLET | Freq: Four times a day (QID) | ORAL | Status: DC | PRN
  Administered 2013-05-28: 650 mg via ORAL
  Filled 2013-05-28: qty 2

## 2013-05-28 MED ORDER — HYDROCHLOROTHIAZIDE 25 MG PO TABS
25.0000 mg | ORAL_TABLET | Freq: Every day | ORAL | Status: DC
  Administered 2013-05-28: 25 mg via ORAL
  Filled 2013-05-28: qty 1

## 2013-05-28 MED ORDER — AMLODIPINE BESYLATE 5 MG PO TABS
5.0000 mg | ORAL_TABLET | Freq: Every day | ORAL | Status: DC
  Filled 2013-05-28: qty 1

## 2013-05-28 MED ORDER — ONDANSETRON HCL 4 MG PO TABS: 4.0000 mg | ORAL_TABLET | Freq: Four times a day (QID) | ORAL | Status: DC | PRN

## 2013-05-28 MED ORDER — AMLODIPINE BESYLATE 10 MG PO TABS
10.0000 mg | ORAL_TABLET | Freq: Every day | ORAL | Status: DC
  Filled 2013-05-28: qty 1

## 2013-05-28 MED ORDER — ONDANSETRON HCL 4 MG/2ML IJ SOLN: 4.0000 mg | Freq: Four times a day (QID) | INTRAMUSCULAR | Status: DC | PRN

## 2013-05-28 MED ORDER — HYDROCHLOROTHIAZIDE 25 MG PO TABS: 25.0000 mg | ORAL_TABLET | Freq: Every day | ORAL | Status: DC

## 2013-05-28 MED ORDER — HYDROCHLOROTHIAZIDE 25 MG PO TABS
25.0000 mg | ORAL_TABLET | Freq: Every day | ORAL | Status: DC
  Filled 2013-05-28: qty 1

## 2013-05-28 MED ORDER — SODIUM CHLORIDE 0.9 % IJ SOLN: 3.0000 mL | Freq: Two times a day (BID) | INTRAMUSCULAR | Status: DC

## 2013-05-28 MED ORDER — AMLODIPINE BESYLATE 10 MG PO TABS
10.0000 mg | ORAL_TABLET | Freq: Every day | ORAL | Status: DC
  Administered 2013-05-28: 10 mg via ORAL
  Filled 2013-05-28: qty 1

## 2013-05-28 MED ORDER — SODIUM CHLORIDE 0.9 % IJ SOLN
3.0000 mL | Freq: Two times a day (BID) | INTRAMUSCULAR | Status: DC
  Administered 2013-05-28: 3 mL via INTRAVENOUS

## 2013-05-28 MED ORDER — AMLODIPINE BESYLATE 10 MG PO TABS: 10.0000 mg | ORAL_TABLET | Freq: Every day | ORAL | Status: DC

## 2013-05-28 NOTE — Care Management Note (Signed)
    Page 1 of 1   05/28/2013     10:30:53 AM   CARE MANAGEMENT NOTE 05/28/2013  Patient:  Cynthia Kemp, Cynthia Kemp   Account Number:  [id number]  Date Initiated:  05/28/2013  Documentation initiated by:  GRAVES-BIGELOW,BRENDA  Subjective/Objective Assessment:   Pt admitted with Headache and nasal bleeding. Increased Bp.     Action/Plan:   CM did speak to pt and she has made an appointment with [facility] at [facility]. Visit will be 140.00. CM did give pt a # to the [facility] and [employer]. CM did call FC for assistance with bill and ? medicaid.   Anticipated DC Date:  05/29/2013   Anticipated DC Plan:  HOME/SELF CARE  In-house referral  Financial Counselor      DC Planning Services  CM consult      Choice offered to / List presented to:             Status of service:  Completed, signed off Medicare Important Message given?   (If response is "NO", the following Medicare IM given date fields will be blank) Date Medicare IM given:   Date Additional Medicare IM given:    Discharge Disposition:  HOME W HOME HEALTH SERVICES  Per UR Regulation:  Reviewed for med. necessity/level of care/duration of stay  If discussed at Long Length of Stay Meetings, dates discussed:    Comments:  05-28-13 1027 Tomi Bamberger, RN,BSN [phone] CM did speak to pt in reference to cost of medications and the plan will be to get meds form [facility] from the $4.00 med list. Please make sure d/c meds are generic and the cheapest as possible. MATCH Program will not be utilized for this pt. No further needs from CM at this time.

## 2013-05-28 NOTE — H&P (Signed)
[facility] History and Physical  FLOIS MCTAGUE ZOX:[id number] DOB: 10-Oct-1956 DOA: 05/27/2013  Referring physician: ER physician. PCP: No PCP Per Patient  Specialists: None.  Chief Complaint: Headache and nasal bleeding.  HPI: Cynthia Kemp is a 57 y.o. female with known history of hypertension presently on no medications who is planning to follow her new PCP in 2 weeks started experiencing headache with nasal bleed last 2 days. Patient states recently she has been checking her blood pressure skin running high. Last 2 days she started experiencing global headache with epistaxis and originally had gone to urgent care Center and was transferred to ER. In the ER patient was found to have systolic blood pressure of 230 with diastolic of 130 and patient was initially started on Cardene which was tapered off and later patient was placed on labetalol when necessary IV for systolic blood pressure more than 180. At this time patient has been admitted for further management. Patient denies any focal deficits chest pain. Patient has had some mild shortness of breath on exertion which patient states is not new. Patient used to be on antihypertensive 10 years ago which was discontinued after patient had lost weight and her blood pressure got better.  Review of Systems: As presented in the history of presenting illness, rest negative.  Past Medical History  Diagnosis Date  . Hypertension   . Psoriasis    Past Surgical History  Procedure Laterality Date  . Abdominal hysterectomy    . Tonsillectomy    . Appendectomy     Social History:  reports that she has never smoked. She does not have any smokeless tobacco history on file. She reports that she does not drink alcohol or use illicit drugs. lives at home.  where does patient live--  can do ADLs.  Can patient participate in ADLs?  Allergies  Allergen Reactions  . Ibuprofen     REACTION: face swells  . Penicillins     unknown     Family History  Problem Relation Age of Onset  . Hypertension Mother   . Hypertension Brother       Prior to Admission medications   Medication Sig Start Date End Date Taking? Authorizing Provider  aspirin-acetaminophen-caffeine (EXCEDRIN MIGRAINE) [phone] MG per tablet Take 1 tablet by mouth every 6 (six) hours as needed for pain.   Yes Historical Provider, MD  hydrochlorothiazide (HYDRODIURIL) 25 MG tablet Take 1 tablet (25 mg total) by mouth daily. 05/27/13   Hayden Rasmussen, NP   Physical Exam: Filed Vitals:   05/28/13 0030 05/28/13 0045 05/28/13 0100 05/28/13 0115  BP: 181/76 173/75 197/94 153/80  Pulse: 96 93 101 81  Temp:      TempSrc:      Resp:      SpO2: 96% 97% 100% 95%     General:   well-developed well-nourished.   Eyes: Anicteric no pallor.   ENT: No discharge from ears eyes nose mouth.   Neck: No mass felt no neck rigidity.   Cardiovascular:  S1-S2 heard.   Respiratory:  no rhonchi or crepitations.   Abdomen:  soft nontender bowel sounds present.   Skin: Chronic rash.  Musculoskeletal:  no edema.   Psychiatric:  appears normal.   Neurologic: alert awake oriented to time place and person. Moves all extremities.   Labs on Admission:  Basic Metabolic Panel:  Recent Labs Lab 05/27/13 1945  NA 135  K 4.1  CL 99  CO2 27  GLUCOSE 146*  BUN 16  CREATININE 0.68  CALCIUM 9.5   Liver Function Tests:  Recent Labs Lab 05/27/13 1945  AST 50*  ALT 25  ALKPHOS 113  BILITOT 0.7  PROT 7.8  ALBUMIN 4.0   No results found for this basename: LIPASE, AMYLASE,  in the last 168 hours No results found for this basename: AMMONIA,  in the last 168 hours CBC:  Recent Labs Lab 05/27/13 1945  WBC 8.0  NEUTROABS 5.8  HGB 13.0  HCT 37.9  MCV 83.7  PLT 213   Cardiac Enzymes:  Recent Labs Lab 05/27/13 1945  TROPONINI <0.30    BNP (last 3 results)  Recent Labs  05/27/13 1945  PROBNP 457.5*   CBG: No results found for this basename:  GLUCAP,  in the last 168 hours  Radiological Exams on Admission: Dg Chest 2 View  05/28/2013   *RADIOLOGY REPORT*  Clinical Data: Shortness of breath and high blood pressure.  CHEST - 2 VIEW  Comparison: 11/06/2008  Findings: The The heart size and pulmonary vascularity are normal. The lungs appear clear and expanded without focal air space disease or consolidation. No blunting of the costophrenic angles.  No pneumothorax.  Mediastinal contours appear intact.  No significant change since previous study.  Degenerative changes in the spine.  IMPRESSION: No evidence of active pulmonary disease.   Original Report Authenticated By: Burman Nieves, M.D.     Assessment/Plan Principal Problem:   Hypertension, accelerated Active Problems:   Headache(784.0)   History of psoriasis   1. Accelerated hypertension  - at this time patient has been continued on labetalol 20 mg IV Q2 hourly when necessary for systolic blood pressure more than 180. Patient has been started on amlodipine and HCTZ. Closely follow blood pressure trends.  2. Headache and epistaxis - was likely secondary to #1. Patient at this time is declining CT head and would consider if headache persist. 3. History of psoriasis.    Code Status: full code.   Family Communication:  patient's daughter at the bedside.   Disposition Plan:  admit to inpatient.     KAKRAKANDY,ARSHAD N. [facility] Pager [phone].  If 7PM-7AM, please contact night-coverage [email or web address] Password [email or web address] 05/28/2013, 1:54 AM

## 2013-05-28 NOTE — Progress Notes (Signed)
  Echocardiogram 2D Echocardiogram has been performed.  Cynthia Kemp, Cynthia Kemp 05/28/2013, 9:10 AM

## 2013-05-28 NOTE — Progress Notes (Signed)
D/c orders received;IV removed with gauze on, pt remains in stable condition, pt meds and instructions reviewed and given to pt; pt d/c to home 

## 2013-05-28 NOTE — Discharge Summary (Signed)
Physician Discharge Summary  Patient ID: FREDDY SPADAFORA MRN: [id number] DOB/AGE: August 29, 1956 57 y.o.  Admit date: 05/27/2013 Discharge date: 05/28/2013  Primary Care Physician:  Kristian Covey, MD  Discharge Diagnoses:    . Hypertension, accelerated . 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 Ibuprofen     REACTION: face swells  . Penicillins     unknown     Discharge Medications:   Medication List    TAKE these medications       amLODipine 10 MG tablet  Commonly known as:  NORVASC  Take 1 tablet (10 mg total) by mouth daily.     aspirin-acetaminophen-caffeine 250-250-65 MG per tablet  Commonly known as:  EXCEDRIN MIGRAINE  Take 1 tablet by mouth every 6 (six) hours as needed for pain.     hydrochlorothiazide 25 MG tablet  Commonly known as:  HYDRODIURIL  Take 1 tablet (25 mg total) by mouth daily.         Brief H and P: For complete details please refer to admission H and P, but in brief Cynthia Kemp is a 57 y.o. female with known history of hypertension presently on no medications who is planning to follow her new PCP in 2 weeks started experiencing headache with nasal bleed last 2 days prior to admission. Patient states recently she has been checking her blood pressure and it is running high. Last 2 days she started experiencing global headache with epistaxis and originally had gone to urgent care Center and was transferred to ER. In the ER patient was found to have systolic blood pressure of 230 with diastolic of 130 and patient was initially started on Cardene which was tapered off and later patient was placed on labetalol when necessary IV for systolic blood pressure more than 180. Patient  was admitted for  further management. Patient denied any focal deficits or chest pain. Patient had some mild shortness of breath on exertion which patient states is not new. Patient used to be on antihypertensive 10 years ago which was discontinued after patient had lost weight and her blood pressure got better.   Hospital Course:     Hypertension, accelerated: Now improved, patient was placed on Cardene drip in ED subsequently labetalol as needed. Patient was placed on Norvasc and hydrochlorothiazide for BP control. She was recommended diet and weight control, low-salt diet. BNP was elevated at 457, hence 2D ECHO was done and per verbal report by Dr Royann Shivers, patient has normal EF of 55% with grade 1 diastolic CHF likely due to uncontrolled hypertension.     Headache(784.0)- improved after BP control    History of psoriasis- stable   Day of Discharge BP 168/94  Pulse 81  Temp(Src) 99 F (37.2 C) (Oral)  Resp 20  Ht 5\' 8"  (1.727 m)  Wt 131.044 kg (288 lb 14.4 oz)  BMI 43.94 kg/m2  SpO2 96%  LMP 05/27/2013  Physical Exam: General: Alert and awake oriented x3 not in any acute distress. CVS: S1-S2 clear no murmur rubs or gallops Chest: clear to auscultation bilaterally, no wheezing rales or rhonchi Abdomen: Obese, soft nontender, nondistended, normal bowel sounds, no organomegaly Extremities: no cyanosis, clubbing or edema noted bilaterally Neuro: Cranial nerves II-XII intact, no focal neurological deficits  The results of significant diagnostics from this hospitalization (including imaging, microbiology, ancillary and laboratory) are listed below for reference.    LAB RESULTS: Basic Metabolic Panel:  Recent Labs Lab 05/27/13 1945 05/28/13 0435  NA 135 133*  K 4.1 3.9  CL 99 96  CO2 27 28  GLUCOSE 146* 253*  BUN 16 15  CREATININE 0.68 0.70  CALCIUM 9.5 9.3   Liver Function Tests:  Recent Labs Lab 05/27/13 1945  AST 50*  ALT 25  ALKPHOS 113  BILITOT 0.7  PROT 7.8  ALBUMIN 4.0    CBC:  Recent Labs Lab 05/27/13 1945 05/28/13 0435  WBC 8.0 8.7  NEUTROABS 5.8  --   HGB 13.0 12.5  HCT 37.9 37.1  MCV 83.7 84.3  PLT 213 215   Cardiac Enzymes:  Recent Labs Lab 05/27/13 1945  TROPONINI <0.30    Significant Diagnostic Studies:  Dg Chest 2 View  05/28/2013   *RADIOLOGY REPORT*  Clinical Data: Shortness of breath and high blood pressure.  CHEST - 2 VIEW  Comparison: 11/06/2008  Findings: The The heart size and pulmonary vascularity are normal. The lungs appear clear and expanded without focal air space disease or consolidation. No blunting of the costophrenic angles.  No pneumothorax.  Mediastinal contours appear intact.  No significant change since previous study.  Degenerative changes in the spine.  IMPRESSION: No evidence of active pulmonary disease.   Original Report Authenticated By: Burman Nieves, M.D.    2D ECHO: Study Conclusions  - Left ventricle: The cavity size was normal. Wall thickness was increased in a pattern of mild LVH. Systolic function was normal. The estimated ejection fraction was in the range of 55% to 60%. Wall motion was normal; there were no regional wall motion abnormalities. Features are consistent with a pseudonormal left ventricular filling pattern, with concomitant abnormal relaxation and increased filling pressure (grade 2 diastolic dysfunction). Doppler parameters are consistent with elevated mean left atrial filling pressure. - Left atrium: The atrium was moderately dilated.    Disposition and Follow-up:     Discharge Orders   Future Appointments Provider Department Dept Phone   06/11/2013 10:00 AM Kristian Covey, MD [facility] [place] [phone]   Future Orders Complete By Expires     Diet - low sodium heart healthy  As directed     Increase activity slowly  As directed         DISPOSITION: Home  DIET: Heart healthy diet  ACTIVITY: As tolerated   DISCHARGE FOLLOW-UP Follow-up  Information   Follow up with Kristian Covey, MD On 06/11/2013. (AT 10:00 AM)    Contact information:   [address] [place] [address] [phone]       Time spent on Discharge: 35 mins  Signed:   RAI,RIPUDEEP M.D. [facility] 05/28/2013, 11:22 AM Pager: [phone]

## 2013-06-05 ENCOUNTER — Inpatient Hospital Stay: Payer: 59

## 2013-06-05 ENCOUNTER — Ambulatory Visit: Payer: Self-pay | Attending: Family Medicine | Admitting: Internal Medicine

## 2013-06-05 VITALS — BP 171/97 | HR 88 | Temp 98.7°F | Resp 18 | Ht 68.0 in | Wt 287.0 lb

## 2013-06-05 DIAGNOSIS — I10 Essential (primary) hypertension: Secondary | ICD-10-CM

## 2013-06-05 DIAGNOSIS — G43009 Migraine without aura, not intractable, without status migrainosus: Secondary | ICD-10-CM

## 2013-06-05 MED ORDER — ATENOLOL 25 MG PO TABS: 25.0000 mg | ORAL_TABLET | Freq: Every day | ORAL | Status: DC

## 2013-06-05 NOTE — Patient Instructions (Signed)

## 2013-06-05 NOTE — Progress Notes (Signed)
Patient ID: Cynthia Kemp, female   DOB: 1956/06/26, 57 y.o.   MRN: [id number]  CC: Followup from hospital visit  HPI: 57 year old female with past medical history of hypertension and psoriasis, migraine headaches who comes in for followup after recent hospitalization for accelerated hypertension. Patient was discharged from [facility] 05/28/2013. And at that time 2-D echo showed ejection fraction of 55-60% and her BNP was slightly less than 500. At this time, patient has no complaints of shortness of breath, chest pain or palpitations. Patient has no complaints of nausea, vomiting or abdominal pain.  Allergies  Allergen Reactions  . Ibuprofen     REACTION: face swells  . Penicillins     unknown   Past Medical History  Diagnosis Date  . Hypertension   . Psoriasis    Current Outpatient Prescriptions on File Prior to Visit  Medication Sig Dispense Refill  . amLODipine (NORVASC) 10 MG tablet Take 1 tablet (10 mg total) by mouth daily.  30 tablet  3  . hydrochlorothiazide (HYDRODIURIL) 25 MG tablet Take 1 tablet (25 mg total) by mouth daily.  30 tablet  3  . aspirin-acetaminophen-caffeine (EXCEDRIN MIGRAINE) 250-250-65 MG per tablet Take 1 tablet by mouth every 6 (six) hours as needed for pain.       No current facility-administered medications on file prior to visit.   Family History  Problem Relation Age of Onset  . Hypertension Mother   . Hypertension Brother    History   Social History  . Marital Status: Divorced    Spouse Name: N/A    Number of Children: N/A  . Years of Education: N/A   Occupational History  . Not on file.   Social History Main Topics  . Smoking status: Never Smoker   . Smokeless tobacco: Not on file  . Alcohol Use: No  . Drug Use: No  . Sexually Active: Not on file   Other Topics Concern  . Not on file   Social History Narrative  . No narrative on file    Review of Systems  Constitutional: Negative for fever, chills, diaphoresis, activity  change, appetite change and fatigue.  HENT: Negative for ear pain, nosebleeds, congestion, facial swelling, rhinorrhea, neck pain, neck stiffness and ear discharge.   Eyes: Negative for pain, discharge, redness, itching and visual disturbance.  Respiratory: Negative for cough, choking, chest tightness, shortness of breath, wheezing and stridor.   Cardiovascular: Negative for chest pain, palpitations and leg swelling.  Gastrointestinal: Negative for abdominal distention.  Genitourinary: Negative for dysuria, urgency, frequency, hematuria, flank pain, decreased urine volume, difficulty urinating and dyspareunia.  Musculoskeletal: Negative for back pain, joint swelling, arthralgias and gait problem.  Neurological: Negative for dizziness, tremors, seizures, syncope, facial asymmetry, speech difficulty, weakness, light-headedness, numbness and headaches.  Hematological: Negative for adenopathy. Does not bruise/bleed easily.  Psychiatric/Behavioral: Negative for hallucinations, behavioral problems, confusion, dysphoric mood, decreased concentration and agitation.    Objective:   Filed Vitals:   06/05/13 1001  BP: 171/97  Pulse: 88  Temp: 98.7 F (37.1 C)  Resp: 18    Physical Exam  Constitutional: Appears well-developed and well-nourished. No distress.  HENT: Normocephalic. External right and left ear normal. Oropharynx is clear and moist.  Eyes: Conjunctivae and EOM are normal. PERRLA, no scleral icterus.  Neck: Normal ROM. Neck supple. No JVD. No tracheal deviation. No thyromegaly.  CVS: RRR, S1/S2 +, no murmurs, no gallops, no carotid bruit.  Pulmonary: Effort and breath sounds normal, no stridor, rhonchi, wheezes,  rales.  Abdominal: Soft. BS +,  no distension, tenderness, rebound or guarding.  Musculoskeletal: Normal range of motion. No edema and no tenderness.  Lymphadenopathy: No lymphadenopathy noted, cervical, inguinal. Neuro: Alert. Normal reflexes, muscle tone coordination. No  cranial nerve deficit. Skin: Skin is warm and dry. No rash noted. Not diaphoretic. No erythema. No pallor.  Psychiatric: Normal mood and affect. Behavior, judgment, thought content normal.   Lab Results  Component Value Date   WBC 8.7 05/28/2013   HGB 12.5 05/28/2013   HCT 37.1 05/28/2013   MCV 84.3 05/28/2013   PLT 215 05/28/2013   Lab Results  Component Value Date   CREATININE 0.70 05/28/2013   BUN 15 05/28/2013   NA 133* 05/28/2013   K 3.9 05/28/2013   CL 96 05/28/2013   CO2 28 05/28/2013    Lab Results  Component Value Date   HGBA1C 5.0 10/17/2006   Lipid Panel     Component Value Date/Time   CHOL 152 10/17/2006 0820   HDL 29.5* 10/17/2006 0820   VLDL 16 10/17/2006 0820   LDLCALC 107* 10/17/2006 0820       Assessment and plan:   Patient Active Problem List   Diagnosis Date Noted  . Hypertension, uncontrolled - Patient is compliant with Norvasc 10 mg daily and HCTZ 25 mg daily  - As blood pressure still elevated at 177  - I have reviewed blood pressure measurements taken by the patient at home which seems to be relatively controlled, high systolic blood pressure less than 150  - We will however add atenolol low-dose 25 mg daily and instructed the patient to combat and check with [place] in 2 weeks to see if the blood pressure is improved  - We have discussed target BP range - I have advised pt to check BP regularly and to call us back if the numbers are higher than 140/90 - discussed the importance of compliance with medical therapy and diet  05/28/2013

## 2013-06-05 NOTE — Progress Notes (Signed)
Patient presents for Hospital follow up for hypertension and heart failure.

## 2013-06-11 ENCOUNTER — Ambulatory Visit: Payer: Self-pay | Admitting: Family Medicine

## 2013-06-19 ENCOUNTER — Encounter: Payer: Self-pay | Admitting: Internal Medicine

## 2013-06-19 ENCOUNTER — Ambulatory Visit: Payer: Self-pay | Attending: Family Medicine | Admitting: Internal Medicine

## 2013-06-19 VITALS — BP 153/81 | HR 75 | Temp 98.1°F | Resp 18 | Ht 68.0 in | Wt 289.8 lb

## 2013-06-19 DIAGNOSIS — I10 Essential (primary) hypertension: Secondary | ICD-10-CM | POA: Insufficient documentation

## 2013-06-19 MED ORDER — AMLODIPINE BESYLATE 10 MG PO TABS: 10.0000 mg | ORAL_TABLET | Freq: Every day | ORAL | Status: DC

## 2013-06-19 MED ORDER — HYDROCHLOROTHIAZIDE 25 MG PO TABS: 25.0000 mg | ORAL_TABLET | Freq: Every day | ORAL | Status: DC

## 2013-06-19 MED ORDER — ATENOLOL 25 MG PO TABS: 25.0000 mg | ORAL_TABLET | Freq: Every day | ORAL | Status: DC

## 2013-06-19 MED ORDER — HYDROCORTISONE 1 % EX GEL: 1.0000 | CUTANEOUS | Status: DC | PRN

## 2013-06-19 NOTE — Progress Notes (Signed)
Pt here for f/u started on bp med on previous visit. Taking Atenolol and tolerating well. Vss. Denies pain.

## 2013-06-19 NOTE — Progress Notes (Signed)
Patient ID: Cynthia Kemp, female   DOB: June 22, 1956, 57 y.o.   MRN: [id number]   CC: Followup  HPI: Patient is a 57 year old female with uncontrolled hypertension, presents to clinic for regular followup. She explains she has visited emergency room few weeks prior to this visit and her blood pressure was greater than 200/100. She was started on 3 different medicines and comes back today to have blood pressure checked. She explains she has been tolerating medicines well and is taking it as prescribed. She denies chest pain or shortness of breath, no specific abdominal or urinary concerns. She denies any visual changes, her headaches are much improved, no focal neurological deficits.  Allergies  Allergen Reactions  . Ibuprofen     REACTION: face swells  . Penicillins     unknown   Past Medical History  Diagnosis Date  . Hypertension   . Psoriasis    Current Outpatient Prescriptions on File Prior to Visit  Medication Sig Dispense Refill  . amLODipine (NORVASC) 10 MG tablet Take 1 tablet (10 mg total) by mouth daily.  30 tablet  3  . aspirin-acetaminophen-caffeine (EXCEDRIN MIGRAINE) 250-250-65 MG per tablet Take 1 tablet by mouth every 6 (six) hours as needed for pain.      Marland Kitchen atenolol (TENORMIN) 25 MG tablet Take 1 tablet (25 mg total) by mouth daily.  30 tablet  0  . hydrochlorothiazide (HYDRODIURIL) 25 MG tablet Take 1 tablet (25 mg total) by mouth daily.  30 tablet  3   No current facility-administered medications on file prior to visit.   Family History  Problem Relation Age of Onset  . Hypertension Mother   . Hypertension Brother    History   Social History  . Marital Status: Divorced    Spouse Name: N/A    Number of Children: N/A  . Years of Education: N/A   Occupational History  . Not on file.   Social History Main Topics  . Smoking status: Never Smoker   . Smokeless tobacco: Not on file  . Alcohol Use: No  . Drug Use: No  . Sexually Active: Not on file   Other  Topics Concern  . Not on file   Social History Narrative  . No narrative on file    Review of Systems  Constitutional: Negative for fever, chills, diaphoresis, activity change, appetite change and fatigue.  HENT: Negative for ear pain, nosebleeds, congestion, facial swelling, rhinorrhea, neck pain, neck stiffness and ear discharge.   Eyes: Negative for pain, discharge, redness, itching and visual disturbance.  Respiratory: Negative for cough, choking, chest tightness, shortness of breath, wheezing and stridor.   Cardiovascular: Negative for chest pain, palpitations and leg swelling.  Gastrointestinal: Negative for abdominal distention.  Genitourinary: Negative for dysuria, urgency, frequency, hematuria, flank pain, decreased urine volume, difficulty urinating and dyspareunia.  Musculoskeletal: Negative for back pain, joint swelling, arthralgias and gait problem.  Neurological: Negative for dizziness, tremors, seizures, syncope, facial asymmetry, speech difficulty, weakness, light-headedness, numbness and headaches.  Hematological: Negative for adenopathy. Does not bruise/bleed easily.  Psychiatric/Behavioral: Negative for hallucinations, behavioral problems, confusion, dysphoric mood, decreased concentration and agitation.    Objective:   Filed Vitals:   06/19/13 0912  BP: 153/81  Pulse: 75  Temp: 98.1 F (36.7 C)  Resp: 18    Physical Exam  Constitutional: Appears well-developed and well-nourished. No distress.  CVS: RRR, S1/S2 +, no murmurs, no gallops, no carotid bruit.  Pulmonary: Effort and breath sounds normal, no stridor, rhonchi, wheezes,  rales.  Abdominal: Soft. BS +,  no distension, tenderness, rebound or guarding.  Psychiatric: Normal mood and affect. Behavior, judgment, thought content normal.   Lab Results  Component Value Date   WBC 8.7 05/28/2013   HGB 12.5 05/28/2013   HCT 37.1 05/28/2013   MCV 84.3 05/28/2013   PLT 215 05/28/2013   Lab Results  Component  Value Date   CREATININE 0.70 05/28/2013   BUN 15 05/28/2013   NA 133* 05/28/2013   K 3.9 05/28/2013   CL 96 05/28/2013   CO2 28 05/28/2013    Lab Results  Component Value Date   HGBA1C 5.0 10/17/2006   Lipid Panel     Component Value Date/Time   CHOL 152 10/17/2006 0820   HDL 29.5* 10/17/2006 0820   VLDL 16 10/17/2006 0820   LDLCALC 107* 10/17/2006 0820       Assessment and plan:   Patient Active Problem List   Diagnosis Date Noted  . Hypertension, accelerated -  Much improved, we have discussed continuing checking blood pressure regularly and to call us back if the numbers are persistently higher than 150/90, we have discussed weight loss of 5 pounds, this will be our goal for the next one month. We have discussed dietary changes such as avoiding sugars that comes from juice intake and other process sugars. Patient has verbalized understanding  05/28/2013  . History of psoriasis - will prescribe anti-itch lotion  05/28/2013

## 2013-06-19 NOTE — Patient Instructions (Addendum)

## 2013-08-20 ENCOUNTER — Ambulatory Visit: Payer: Self-pay | Attending: Internal Medicine | Admitting: Internal Medicine

## 2013-08-20 VITALS — BP 147/81 | HR 71 | Temp 97.8°F | Resp 16 | Wt 288.0 lb

## 2013-08-20 DIAGNOSIS — M25519 Pain in unspecified shoulder: Secondary | ICD-10-CM | POA: Insufficient documentation

## 2013-08-20 DIAGNOSIS — I10 Essential (primary) hypertension: Secondary | ICD-10-CM

## 2013-08-20 LAB — LIPID PANEL
Cholesterol: 147 mg/dL (ref 0–200)
HDL: 29 mg/dL — ABNORMAL LOW (ref >39)
LDL Cholesterol: 82 mg/dL (ref 0–99)
Total CHOL/HDL Ratio: 5.1 Ratio
Triglycerides: 179 mg/dL — ABNORMAL HIGH (ref <150)
VLDL: 36 mg/dL (ref 0–40)

## 2013-08-20 LAB — BASIC METABOLIC PANEL
BUN: 18 mg/dL (ref 6–23)
CO2: 32 mEq/L (ref 19–32)
Calcium: 9.4 mg/dL (ref 8.4–10.5)
Chloride: 95 mEq/L — ABNORMAL LOW (ref 96–112)
Creat: 0.85 mg/dL (ref 0.50–1.10)
Glucose, Bld: 330 mg/dL — ABNORMAL HIGH (ref 70–99)
Potassium: 3.5 mEq/L (ref 3.5–5.3)
Sodium: 135 mEq/L (ref 135–145)

## 2013-08-20 LAB — HEMOGLOBIN A1C
Hgb A1c MFr Bld: 9 % — ABNORMAL HIGH (ref <5.7)
Mean Plasma Glucose: 212 mg/dL — ABNORMAL HIGH (ref <117)

## 2013-08-20 MED ORDER — OXYCODONE-ACETAMINOPHEN 5-325 MG PO TABS: 1.0000 | ORAL_TABLET | Freq: Three times a day (TID) | ORAL | Status: DC | PRN

## 2013-08-20 MED ORDER — TRAMADOL HCL 50 MG PO TABS: 50.0000 mg | ORAL_TABLET | Freq: Three times a day (TID) | ORAL | Status: DC | PRN

## 2013-08-20 NOTE — Progress Notes (Signed)
Patient ID: Cynthia Kemp, female   DOB: 1956-10-07, 57 y.o.   MRN: [id number]   CC: Right shoulder pain  HPI: Patient is 57 year old female with history of hypertension who presents to clinic with main concern of a 3 month duration of right shoulder pain, constant and throbbing, at best 5/10 in severity and at worse 10/10 in severity. Patient reports not being able to lift her right arm past 90 angle, she is unable to rotate her shoulder, has difficulty with extension and flexion. She denies numbness or tingling, no specific focal neurological symptoms. She denies other systemic symptoms, no chest pain or shortness of breath, no specific abdominal concerns. Patient denies any specific injury to the right shoulder, no trauma, no similar events in the past.  Allergies  Allergen Reactions  . Ibuprofen     REACTION: face swells  . Penicillins     unknown   Past Medical History  Diagnosis Date  . Hypertension   . Psoriasis    Current Outpatient Prescriptions on File Prior to Visit  Medication Sig Dispense Refill  . amLODipine (NORVASC) 10 MG tablet Take 1 tablet (10 mg total) by mouth daily.  30 tablet  11  . aspirin-acetaminophen-caffeine (EXCEDRIN MIGRAINE) 250-250-65 MG per tablet Take 1 tablet by mouth every 6 (six) hours as needed for pain.      Marland Kitchen atenolol (TENORMIN) 25 MG tablet Take 1 tablet (25 mg total) by mouth daily.  30 tablet  11  . hydrochlorothiazide (HYDRODIURIL) 25 MG tablet Take 1 tablet (25 mg total) by mouth daily.  30 tablet  11  . HYDROCORTISONE, TOPICAL, 1 % GEL Apply 1 Container topically as needed.  56 g  5   No current facility-administered medications on file prior to visit.   Family History  Problem Relation Age of Onset  . Hypertension Mother   . Hypertension Brother    History   Social History  . Marital Status: Divorced    Spouse Name: N/A    Number of Children: N/A  . Years of Education: N/A   Occupational History  . Not on file.   Social  History Main Topics  . Smoking status: Never Smoker   . Smokeless tobacco: Not on file  . Alcohol Use: No  . Drug Use: No  . Sexual Activity: Not on file   Other Topics Concern  . Not on file   Social History Narrative  . No narrative on file    Review of Systems  Constitutional: Negative for fever, chills, diaphoresis, activity change, appetite change and fatigue.  HENT: Negative for ear pain, nosebleeds, congestion, facial swelling, rhinorrhea, neck pain, neck stiffness and ear discharge.   Eyes: Negative for pain, discharge, redness, itching and visual disturbance.  Respiratory: Negative for cough, choking, chest tightness, shortness of breath, wheezing and stridor.   Cardiovascular: Negative for chest pain, palpitations and leg swelling.  Gastrointestinal: Negative for abdominal distention.  Genitourinary: Negative for dysuria, urgency, frequency, hematuria, flank pain, decreased urine volume, difficulty urinating and dyspareunia.  Musculoskeletal: Negative for back pain, and gait problem.  Neurological: Negative for dizziness, tremors, seizures, syncope, facial asymmetry, speech difficulty, weakness, light-headedness, numbness and headaches.  Hematological: Negative for adenopathy. Does not bruise/bleed easily.  Psychiatric/Behavioral: Negative for hallucinations, behavioral problems, confusion, dysphoric mood, decreased concentration and agitation.    Objective:   Filed Vitals:   08/20/13 1019  BP: 147/81  Pulse: 71  Temp: 97.8 F (36.6 C)  Resp: 16    Physical Exam  Constitutional: Appears well-developed and well-nourished. No distress.  CVS: RRR, S1/S2 +, no murmurs, no gallops, no carotid bruit.  Pulmonary: Effort and breath sounds normal, no stridor, rhonchi, wheezes, rales.  Abdominal: Soft. BS +,  no distension, tenderness, rebound or guarding.  Musculoskeletal: Normal range of motion except in the right shoulder. Patient has difficulty with extension and  flexion of right upper extremity, unable to rotate the shoulder or arm. Significant tenderness around the shoulder.  Lymphadenopathy: No lymphadenopathy noted, cervical, inguinal. Neuro: Alert. Normal reflexes, muscle tone coordination. No cranial nerve deficit.  Lab Results  Component Value Date   WBC 8.7 05/28/2013   HGB 12.5 05/28/2013   HCT 37.1 05/28/2013   MCV 84.3 05/28/2013   PLT 215 05/28/2013   Lab Results  Component Value Date   CREATININE 0.70 05/28/2013   BUN 15 05/28/2013   NA 133* 05/28/2013   K 3.9 05/28/2013   CL 96 05/28/2013   CO2 28 05/28/2013    Lab Results  Component Value Date   HGBA1C 5.0 10/17/2006   Lipid Panel     Component Value Date/Time   CHOL 152 10/17/2006 0820   HDL 29.5* 10/17/2006 0820   VLDL 16 10/17/2006 0820   LDLCALC 107* 10/17/2006 0820       Assessment and plan:   Patient Active Problem List   Diagnosis Date Noted  . Hypertension, accelerated - patient reports blood pressure usually less than 140/90 at home. Relatively fairly well controlled. Continue current medical regimen and check electrolyte panel today.  05/28/2013  . History of psoriasis - under control  05/28/2013  .  fasting hyperglycemia in June 2014 - will check A1c today  11/30/2007

## 2013-08-20 NOTE — Patient Instructions (Signed)

## 2013-08-20 NOTE — Progress Notes (Signed)
Patient here for follow up Complains of edema to left lower leg Some SOB

## 2013-08-22 ENCOUNTER — Ambulatory Visit: Payer: 59 | Attending: Internal Medicine

## 2013-08-30 ENCOUNTER — Telehealth: Payer: Self-pay

## 2013-08-30 NOTE — Telephone Encounter (Signed)
Patient  Is aware of her results  Has an appointment scheduled for 9/18

## 2013-09-03 ENCOUNTER — Telehealth: Payer: Self-pay | Admitting: Emergency Medicine

## 2013-09-03 NOTE — Telephone Encounter (Signed)
Spoke with Dr. Izola Price concerning pt sx visual changes, yellow eyes after taking prescribed BP meds. Pt instructed to hold HCTZ until scheduled appt 09/05/13.

## 2013-09-05 ENCOUNTER — Ambulatory Visit: Payer: Self-pay | Attending: Internal Medicine | Admitting: Internal Medicine

## 2013-09-05 VITALS — BP 185/97 | HR 66 | Temp 97.7°F | Resp 17 | Wt 283.0 lb

## 2013-09-05 DIAGNOSIS — E119 Type 2 diabetes mellitus without complications: Secondary | ICD-10-CM

## 2013-09-05 DIAGNOSIS — H538 Other visual disturbances: Secondary | ICD-10-CM

## 2013-09-05 DIAGNOSIS — I10 Essential (primary) hypertension: Secondary | ICD-10-CM

## 2013-09-05 LAB — HEPATIC FUNCTION PANEL
ALT: 26 U/L (ref 0–35)
AST: 27 U/L (ref 0–37)
Albumin: 4 g/dL (ref 3.5–5.2)
Alkaline Phosphatase: 86 U/L (ref 39–117)
Bilirubin, Direct: 0.2 mg/dL (ref 0.0–0.3)
Indirect Bilirubin: 0.5 mg/dL (ref 0.0–0.9)
Total Bilirubin: 0.7 mg/dL (ref 0.3–1.2)
Total Protein: 7.1 g/dL (ref 6.0–8.3)

## 2013-09-05 MED ORDER — FREESTYLE SYSTEM KIT: 1.0000 | PACK | Status: DC | PRN

## 2013-09-05 MED ORDER — LOSARTAN POTASSIUM 100 MG PO TABS: 100.0000 mg | ORAL_TABLET | Freq: Every day | ORAL | Status: DC

## 2013-09-05 MED ORDER — METFORMIN HCL 500 MG PO TABS: 500.0000 mg | ORAL_TABLET | Freq: Two times a day (BID) | ORAL | Status: DC

## 2013-09-05 NOTE — Progress Notes (Signed)
Patient is concerned about the yellowing of her eyes Feels it is caused from HCTZ and norvasc Has not taken her medication since sunday

## 2013-09-05 NOTE — Progress Notes (Signed)
Patient ID: Cynthia Kemp, female   DOB: 12/31/1955, 57 y.o.   MRN: [id number] Patient Demographics  Cynthia Kemp, is a 57 y.o. female  WUJ:[id number]  [id number]  DOB - 04-Feb-1956  Chief Complaint  Patient presents with  . Eye Problem        Subjective:   Cynthia Kemp today is here for a follow up visit. The patient is a 57 year old female with hypertension, here for followup. Patient states that she noticed yellowness in her eyes and felt it was being caused by HCTZ and Norvasc. Patient had stopped taking all her medications since Sunday. High BP in the clinic is 185/97. She also reports the redness in her right eye, chronically worsening. She has not had  Eye examination. Patient has No headache, No chest pain, No abdominal pain - No Nausea, No new weakness tingling or numbness, No Cough - SOB.   Objective:    Filed Vitals:   09/05/13 0932  BP: 185/97  Pulse: 66  Temp: 97.7 F (36.5 C)  Resp: 17  Weight: 283 lb (128.368 kg)  SpO2: 100%     ALLERGIES:   Allergies  Allergen Reactions  . Ibuprofen     REACTION: face swells  . Penicillins     unknown    PAST MEDICAL HISTORY: Past Medical History  Diagnosis Date  . Hypertension   . Psoriasis     MEDICATIONS AT HOME: Prior to Admission medications   Medication Sig Start Date End Date Taking? Authorizing Provider  aspirin-acetaminophen-caffeine (EXCEDRIN MIGRAINE) [phone] MG per tablet Take 1 tablet by mouth every 6 (six) hours as needed for pain.    Historical Provider, MD  glucose monitoring kit (FREESTYLE) monitoring kit 1 each by Does not apply route as needed for other. 09/05/13   Ripudeep K Rai, MD  HYDROCORTISONE, TOPICAL, 1 % GEL Apply 1 Container topically as needed. 06/19/13   Dorothea Ogle, MD  losartan (COZAAR) 100 MG tablet Take 1 tablet (100 mg total) by mouth daily. 09/05/13   Ripudeep Jenna Luo, MD  metFORMIN (GLUCOPHAGE) 500 MG tablet Take 1 tablet (500 mg total) by mouth 2 (two) times daily  with a meal. 09/05/13   Ripudeep Jenna Luo, MD  oxyCODONE-acetaminophen (ROXICET) 5-325 MG per tablet Take 1 tablet by mouth every 8 (eight) hours as needed for pain. 08/20/13   Dorothea Ogle, MD  traMADol (ULTRAM) 50 MG tablet Take 1 tablet (50 mg total) by mouth every 8 (eight) hours as needed for pain. 08/20/13   Dorothea Ogle, MD     Exam  General appearance :Awake, alert, NAD, Speech Clear.  HEENT: Atraumatic and Normocephalic, PERLA Neck: supple, no JVD. No cervical lymphadenopathy.  Chest: Clear to auscultation bilaterally, no wheezing, rales or rhonchi CVS: S1 S2 regular, no murmurs.  Abdomen: Obese soft, NBS, NT, ND, no gaurding, rigidity or rebound. Extremities: no cyanosis or clubbing, B/L Lower Ext shows no edema Neurology: Awake alert, and oriented X 3, CN II-XII intact, Non focal Skin: No Rash or lesions Wounds:N/A    Data Review   Basic Metabolic Panel: No results found for this basename: NA, K, CL, CO2, GLUCOSE, BUN, CREATININE, CALCIUM, MG, PHOS,  in the last 168 hours Liver Function Tests: No results found for this basename: AST, ALT, ALKPHOS, BILITOT, PROT, ALBUMIN,  in the last 168 hours  CBC: No results found for this basename: WBC, NEUTROABS, HGB, HCT, MCV, PLT,  in the last 168 hours  ------------------------------------------------------------------------------------------------------------------ No results found for this  basename: HGBA1C,  in the last 72 hours ------------------------------------------------------------------------------------------------------------------ No results found for this basename: CHOL, HDL, LDLCALC, TRIG, CHOLHDL, LDLDIRECT,  in the last 72 hours ------------------------------------------------------------------------------------------------------------------ No results found for this basename: TSH, T4TOTAL, FREET3, T3FREE, THYROIDAB,  in the last 72  hours ------------------------------------------------------------------------------------------------------------------ No results found for this basename: VITAMINB12, FOLATE, FERRITIN, TIBC, IRON, RETICCTPCT,  in the last 72 hours  Coagulation profile  No results found for this basename: INR, PROTIME,  in the last 168 hours    Assessment & Plan   Active Problems: ?Icteric sclera: Patient does not have any scleral icterus at this time. Per patient this is improved, I will order LFTs's  Hypertension: - Patient had self discontinued HCTZ, Norvasc, atenolol. Does not like atenolol as it was causing her fatigue. - Due to new-onset diabetes mellitus, placed her on Cozaar 100 mg daily - Explained to the patient that she needs to check her blood pressure daily and bring the log to next appointment if another agent needs to be added at the followup.   New-onset diabetes mellitus - Hemoglobin A1c 9.0 08/20/13,  - send referral to diabetic education classes, glucose monitoring kit  -  started on metformin 500 mg by mouth BID  Blurry vision : This is chronic, possibly due to diabetes and hypertension  - Ambulatory referral for ophthalmology   Recommendations: followup in 4 week, followup on LFTs  Follow-up in 4 weeks     RAI,RIPUDEEP M.D. 09/05/2013, 10:01 AM

## 2013-09-05 NOTE — Patient Instructions (Signed)

## 2013-09-06 ENCOUNTER — Ambulatory Visit (INDEPENDENT_AMBULATORY_CARE_PROVIDER_SITE_OTHER): Payer: Self-pay | Admitting: Endocrinology

## 2013-09-06 ENCOUNTER — Other Ambulatory Visit: Payer: Self-pay | Admitting: *Deleted

## 2013-09-06 ENCOUNTER — Encounter: Payer: Self-pay | Admitting: Endocrinology

## 2013-09-06 VITALS — BP 140/74 | HR 78 | Ht 68.0 in | Wt 284.0 lb

## 2013-09-06 DIAGNOSIS — E119 Type 2 diabetes mellitus without complications: Secondary | ICD-10-CM

## 2013-09-06 MED ORDER — METFORMIN HCL ER 500 MG PO TB24: 1000.0000 mg | ORAL_TABLET | Freq: Two times a day (BID) | ORAL | Status: DC

## 2013-09-06 MED ORDER — GLIMEPIRIDE 1 MG PO TABS: 1.0000 mg | ORAL_TABLET | Freq: Every day | ORAL | Status: DC

## 2013-09-06 MED ORDER — GLUCOSE BLOOD VI STRP: ORAL_STRIP | Status: AC

## 2013-09-06 MED ORDER — TRUEPLUS LANCETS 33G MISC: 1.0000 | Status: AC

## 2013-09-06 NOTE — Patient Instructions (Addendum)
Please tell your primary doctor about your blood pressure medication. good diet and exercise habits significanly improve the control of your diabetes.  please let me know if you wish to be referred to a dietician.  high blood sugar is very risky to your health.  you should see an eye doctor every year.  You are at higher than average risk for pneumonia and hepatitis-B.  You should be vaccinated against both.   controlling your blood pressure and cholesterol drastically reduces the damage diabetes does to your body.  this also applies to quitting smoking.  please discuss these with your doctor.  check your blood sugar once a day.  vary the time of day when you check, between before the 3 meals, and at bedtime.  also check if you have symptoms of your blood sugar being too high or too low.  please keep a record of the readings and bring it to your next appointment here.  please call us sooner if your blood sugar goes below 70, or if you have a lot of readings over 200.   i have sent a prescription to your pharmacy, for an additional diabetes medication.  Please take this and the metformin.

## 2013-09-06 NOTE — Progress Notes (Signed)
Subjective:    Patient ID: Cynthia Kemp, female    DOB: [birth year], 57 y.o.   MRN: [id number]  HPI pt states DM was dx'ed one month ago; she has mild if any neuropathy of the lower extremities; she is unaware of any associated chronic complications.  she has never been on insulin.  pt says his diet and exercise are improved.  She says her cbg meter does not work.   Pt says she is noncompliant with her BP meds, as  She does not like the way they make her feel.   She says she does not have prescription coverage.   Past Medical History  Diagnosis Date  . Hypertension   . Psoriasis     Past Surgical History  Procedure Laterality Date  . Abdominal hysterectomy    . Tonsillectomy    . Appendectomy      History   Social History  . Marital Status: Divorced    Spouse Name: N/A    Number of Children: N/A  . Years of Education: N/A   Occupational History  . Not on file.   Social History Main Topics  . Smoking status: Never Smoker   . Smokeless tobacco: Not on file  . Alcohol Use: No  . Drug Use: No  . Sexual Activity: Not on file   Other Topics Concern  . Not on file   Social History Narrative  . No narrative on file    Current Outpatient Prescriptions on File Prior to Visit  Medication Sig Dispense Refill  . HYDROCORTISONE, TOPICAL, 1 % GEL Apply 1 Container topically as needed.  56 g  5  . oxyCODONE-acetaminophen (ROXICET) 5-325 MG per tablet Take 1 tablet by mouth every 8 (eight) hours as needed for pain.  45 tablet  0  . aspirin-acetaminophen-caffeine (EXCEDRIN MIGRAINE) 250-250-65 MG per tablet Take 1 tablet by mouth every 6 (six) hours as needed for pain.      Marland Kitchen glucose monitoring kit (FREESTYLE) monitoring kit 1 each by Does not apply route as needed for other.  1 each  0  . losartan (COZAAR) 100 MG tablet Take 1 tablet (100 mg total) by mouth daily.  30 tablet  3  . metFORMIN (GLUCOPHAGE) 500 MG tablet Take 1 tablet (500 mg total) by mouth 2 (two) times daily  with a meal.  60 tablet  3  . traMADol (ULTRAM) 50 MG tablet Take 1 tablet (50 mg total) by mouth every 8 (eight) hours as needed for pain.  65 tablet  1   No current facility-administered medications on file prior to visit.   Allergies  Allergen Reactions  . Ibuprofen     REACTION: face swells  . Penicillins     unknown   Family History  Problem Relation Age of Onset  . Hypertension Mother   . Hypertension Brother   DM: mother and 1 sib. BP 140/74  Pulse 78  Ht 5\' 8"  (1.727 m)  Wt 284 lb (128.822 kg)  BMI 43.19 kg/m2  SpO2 97%  LMP 05/27/2013  Review of Systems denies weight loss, headache, chest pain, sob, n/v, urinary frequency, excessive diaphoresis, memory loss, depression, menopausal sxs, rhinorrhea, and easy bruising.  She has right shoulder pain and burry vision.     Objective:   Physical Exam VS: see vs page GEN: no distress HEAD: head: no deformity eyes: no periorbital swelling, no proptosis external nose and ears are normal mouth: no lesion seen NECK: supple, thyroid is not enlarged CHEST  WALL: no deformity LUNGS:  Clear to auscultation CV: reg rate and rhythm, no murmur ABD: abdomen is soft, nontender.  no hepatosplenomegaly.  not distended.  no hernia MUSCULOSKELETAL: muscle bulk and strength are grossly normal.  no obvious joint swelling.  gait is normal and steady PULSES: no carotid bruit.  NEURO:  cn 2-12 grossly intact.   readily moves all 4's.  SKIN:  Normal texture and temperature.  No rash or suspicious lesion is visible.   NODES:  None palpable at the neck PSYCH: alert, oriented x3.  Does not appear anxious nor depressed.  Lab Results  Component Value Date   HGBA1C 9.0* 08/20/2013      Assessment & Plan:  DM: therapy limited by pt's request for least expensive meds Morbid obesity: this limits effectiveness of DM rx Blurry vision, possibly due to hyperglycemia. HTN: therapy limited by noncompliance.

## 2013-09-27 ENCOUNTER — Ambulatory Visit: Payer: 59 | Admitting: Endocrinology

## 2013-10-01 ENCOUNTER — Encounter: Payer: Self-pay | Admitting: Dietician

## 2013-10-01 ENCOUNTER — Encounter: Payer: Self-pay | Attending: Internal Medicine | Admitting: Dietician

## 2013-10-01 VITALS — Ht 68.0 in | Wt 286.3 lb

## 2013-10-01 DIAGNOSIS — Z713 Dietary counseling and surveillance: Secondary | ICD-10-CM | POA: Insufficient documentation

## 2013-10-01 DIAGNOSIS — E119 Type 2 diabetes mellitus without complications: Secondary | ICD-10-CM | POA: Insufficient documentation

## 2013-10-01 NOTE — Progress Notes (Signed)
Appt start time: 1545 end time:  1700.   Assessment:  Patient was seen on  10/01/13 for individual diabetes education.   Elna lives with her two grown children and father and is self employed as an Pensions consultant, though hasn't been working much recently since she hasn't been feeling well. States that she does most of the cooking and food shopping at home. Maryruth eats a low sodium diet since she has high blood pressure.  States that she plans to exercise more since she used to be more active. Looked up carb counting and and had been trying to follow a diet plan since her diagnosis.   Current HbA1c: 9.0 1 month ago  MEDICATIONS: metformin (takes at lunch and at bedtime)   DIETARY INTAKE:  24-hr recall:  B ( AM):peanut butter on bread or mozzarella or cheerios with skim milk and sugar with coffee with coffee mate and sugar Snk ( AM): crackers and peanut butter or rice cakes or low sugar graham crackers or apple juice  L ( PM): tuna with miracle whip or meatloaf and potatoes, lettuce salad with coffee, water, or apple juice with  Snk ( PM): bowl of cheerios D ( PM): hamburgers, chicken, cereal, soup Snk ( PM): mozzarrella, cheerios Beverages: water, coffee, apple juice  Usual physical activity: walk 30 min 1-2 x week  Estimated energy needs: 1600 calories 180 g carbohydrates 120 g protein 44 g fat  Progress Towards Goal(s):  In progress.   Nutritional Diagnosis:  [place]-2.1 Inpaired nutrition utilization As related to glucose metabolism.  As evidenced by Hgb A1c of 9.0%.     Intervention:  Nutrition counseling provided.  Discussed diabetes disease process and treatment options.  Discussed physiology of diabetes and role of obesity on insulin resistance.  Encouraged moderate weight reduction to improve glucose levels.  Discussed role of medications and diet in glucose control  Provided education on macronutrients on glucose levels.  Provided education on carb counting, importance of  regularly scheduled meals/snacks, and meal planning  Discussed effects of physical activity on glucose levels and long-term glucose control.  Recommended 150 minutes of physical activity/week.  Reviewed patient medications.  Discussed role of medication on blood glucose and possible side effects  Discussed blood glucose monitoring and interpretation.  Discussed recommended target ranges and individual ranges.    Described short-term complications: hyper- and hypo-glycemia.  Discussed causes,symptoms, and treatment options.  Discussed prevention, detection, and treatment of long-term complications.  Discussed the role of prolonged elevated glucose levels on body systems.  Discussed role of stress on blood glucose levels and discussed strategies to manage psychosocial issues.  Discussed recommendations for long-term diabetes self-care.  Established checklist for medical, dental, and emotional self-care.  Handouts given during visit include:  Living Well With Diabetes  Yellow Card  My Plate  16X CHO Snacks  Barriers to learning/adherance to lifestyle change: limited financial resources  Diabetes self-care support plan:   [facility] support group  Family (daughter)  Monitoring/Evaluation:  Dietary intake, exercise, and body weight in 3 month(s).

## 2013-10-01 NOTE — Patient Instructions (Signed)
Goals:  Follow Diabetes Meal Plan as instructed  Eat 3 meals and 2 snacks, every 3-5 hrs  Limit carbohydrate intake to 45-60 grams carbohydrate/meal  Limit carbohydrate intake to 15 grams carbohydrate/snack  Add lean protein foods to meals/snacks  Monitor glucose levels as instructed by your doctor  Aim for 30 mins of physical activity daily  Bring food record and glucose log to your next nutrition visit 

## 2013-10-03 ENCOUNTER — Ambulatory Visit: Payer: Self-pay | Attending: Internal Medicine | Admitting: Internal Medicine

## 2013-10-03 ENCOUNTER — Encounter: Payer: Self-pay | Admitting: Internal Medicine

## 2013-10-03 VITALS — BP 172/91 | HR 74 | Temp 98.4°F | Resp 16 | Ht 67.0 in | Wt 285.0 lb

## 2013-10-03 DIAGNOSIS — I10 Essential (primary) hypertension: Secondary | ICD-10-CM | POA: Insufficient documentation

## 2013-10-03 DIAGNOSIS — Z23 Encounter for immunization: Secondary | ICD-10-CM

## 2013-10-03 DIAGNOSIS — IMO0001 Reserved for inherently not codable concepts without codable children: Secondary | ICD-10-CM | POA: Insufficient documentation

## 2013-10-03 DIAGNOSIS — E669 Obesity, unspecified: Secondary | ICD-10-CM

## 2013-10-03 MED ORDER — AMLODIPINE BESYLATE 5 MG PO TABS: 5.0000 mg | ORAL_TABLET | Freq: Every day | ORAL | Status: DC

## 2013-10-03 NOTE — Progress Notes (Signed)
Patient ID: Cynthia Kemp, female   DOB: 02-06-1956, 57 y.o.   MRN: [id number] Chief complaint Follow up  HPI 57 year old obese female with history of uncontrolled hypertension, recently diagnosed diabetes with hemoglobin A1c of 9, here for followup. Patient has been started on metformin 500 mg twice daily. She was referred to endocrinologist who increased the metformin 1000 mg XR twice daily and added glimeperide but patient felt abdominal discomfort with bloating and stopped taking the medicine. She brought a log of her fingersticks monitoring and her fasting blood glucose ranges from 115 to 140. She reportes seen diabetic educator and nutritionist recently and has formulated a diet chart. She has also started walking 20-25 minutes every day on a treadmill. She denies any polyuria, polydipsia, bloody vision, chest pain, palpitations, shortness of breath, abdominal pain, nausea, vomiting, bowel or urinary symptoms.   Filed Vitals:   10/03/13 1118  BP: 172/91  Pulse: 74  Temp: 98.4 F (36.9 C)  TempSrc: Oral  Resp: 16  Height: 5\' 7"  (1.702 m)  Weight: 285 lb (129.275 kg)  SpO2: 95%     Physical Exam:  General: Middle aged obese female in no acute distress. HEENT: no pallor, no icterus, moist oral mucosa,  Heart: Normal  s1 &s2  Regular rate and rhythm, without murmurs, rubs, gallops. Lungs: Clear to auscultation bilaterally. Abdomen: Soft, nontender, nondistended, positive bowel sounds. Extremities: No clubbing cyanosis or edema with positive pedal pulses. Neuro: Alert, awake, oriented x3, nonfocal.   Lab Results:  Basic Metabolic Panel:    Component Value Date/Time   NA 135 08/20/2013 1039   K 3.5 08/20/2013 1039   CL 95* 08/20/2013 1039   CO2 32 08/20/2013 1039   BUN 18 08/20/2013 1039   CREATININE 0.85 08/20/2013 1039   CREATININE 0.70 05/28/2013 0435   GLUCOSE 330* 08/20/2013 1039   CALCIUM 9.4 08/20/2013 1039   CBC:    Component Value Date/Time   WBC 8.7 05/28/2013 0435   HGB  12.5 05/28/2013 0435   HCT 37.1 05/28/2013 0435   PLT 215 05/28/2013 0435   MCV 84.3 05/28/2013 0435   NEUTROABS 5.8 05/27/2013 1945   LYMPHSABS 1.7 05/27/2013 1945   MONOABS 0.4 05/27/2013 1945   EOSABS 0.1 05/27/2013 1945   BASOSABS 0.0 05/27/2013 1945    No results found for this or any previous visit (from the past 240 hour(s)).  Studies/Results: No results found.  Medications: Scheduled Meds: Continuous Infusions: PRN Meds:.    Assessment/Plan:  Uncontrolled diabetes Hemoglobin A1c of 9 Patient on metformin 500 mg twice a day .continue for now. She was not compliant with increased dose of metformin and added glimeperide by her endocrinologist recently. We will recheck her hemoglobin A1c in 6 weeks. Patient encouraged to be compliant with her diet and exercise regularly in order to lose weight.  Uncontrolled hypertension Patient was previously on HCTZ, Norvasc and metoprolol which she self discontinued. She was admitted to the hospital in June for accelerated hypertension as well. He is currently on losartan 100 mg daily. I would add low-dose amlodipine 5 mg daily. She should follow up in the clinic in one week for blood pressure check.  Followup in 6 weeks for repeat A1c, adjust diabetic and BP meds   Health maintenance Ordered flu vaccine  DHUNGEL, NISHANT 10/03/2013, 11:51 AM

## 2013-10-03 NOTE — Progress Notes (Signed)
Pt is here for a f/u visit. Pt is here today to follow up on her HTN and diabetes. Pt reports that she is managing well.

## 2013-11-06 ENCOUNTER — Ambulatory Visit: Payer: Self-pay | Attending: Internal Medicine | Admitting: Internal Medicine

## 2013-11-06 VITALS — BP 165/101 | HR 84 | Temp 98.0°F | Resp 17

## 2013-11-06 DIAGNOSIS — E119 Type 2 diabetes mellitus without complications: Secondary | ICD-10-CM | POA: Insufficient documentation

## 2013-11-06 DIAGNOSIS — I10 Essential (primary) hypertension: Secondary | ICD-10-CM | POA: Insufficient documentation

## 2013-11-06 DIAGNOSIS — F411 Generalized anxiety disorder: Secondary | ICD-10-CM

## 2013-11-06 NOTE — Progress Notes (Signed)
Patient here for follow up DM 

## 2013-11-06 NOTE — Progress Notes (Signed)
Patient ID: Cynthia Kemp, female   DOB: 1956/03/06, 57 y.o.   MRN: [id number]  CC: Followup  HPI: 57 year old female with past medical history of diabetes, hypertension dyslipidemia who presents to clinic for followup. Patient has no current complaints. No chest pain or shortness of breath. No blurry vision, headaches or dizziness.  Allergies  Allergen Reactions  . Ibuprofen     REACTION: face swells  . Mushroom Extract Complex   . Penicillins     unknown   Past Medical History  Diagnosis Date  . Hypertension   . Psoriasis    Current Outpatient Prescriptions on File Prior to Visit  Medication Sig Dispense Refill  . amLODipine (NORVASC) 5 MG tablet Take 1 tablet (5 mg total) by mouth daily.  90 tablet  3  . aspirin-acetaminophen-caffeine (EXCEDRIN MIGRAINE) 250-250-65 MG per tablet Take 1 tablet by mouth every 6 (six) hours as needed for pain.      Marland Kitchen glucose blood (TRUETEST TEST) test strip And lancets 1/day  100 each  3  . HYDROCORTISONE, TOPICAL, 1 % GEL Apply 1 Container topically as needed.  56 g  5  . losartan (COZAAR) 100 MG tablet Take 1 tablet (100 mg total) by mouth daily.  30 tablet  3  . metFORMIN (GLUCOPHAGE) 500 MG tablet Take 500 mg by mouth 2 (two) times daily with a meal.      . oxyCODONE-acetaminophen (ROXICET) 5-325 MG per tablet Take 1 tablet by mouth every 8 (eight) hours as needed for pain.  45 tablet  0  . traMADol (ULTRAM) 50 MG tablet Take 1 tablet (50 mg total) by mouth every 8 (eight) hours as needed for pain.  65 tablet  1  . TRUEPLUS LANCETS 33G MISC 1 each by Does not apply route as directed.  100 each  3   No current facility-administered medications on file prior to visit.   Family History  Problem Relation Age of Onset  . Hypertension Mother   . Hypertension Brother   . Asthma Other   . COPD Other   . Hypertension Other   . Depression Other   . Stroke Other   . Heart disease Other    History   Social History  . Marital Status: Divorced     Spouse Name: N/A    Number of Children: N/A  . Years of Education: N/A   Occupational History  . Not on file.   Social History Main Topics  . Smoking status: Never Smoker   . Smokeless tobacco: Not on file  . Alcohol Use: No  . Drug Use: No  . Sexual Activity: Not on file   Other Topics Concern  . Not on file   Social History Narrative  . No narrative on file    Review of Systems  Constitutional: Negative for fever, chills, diaphoresis, activity change, appetite change and fatigue.  HENT: Negative for ear pain, nosebleeds, congestion, facial swelling, rhinorrhea, neck pain, neck stiffness and ear discharge.   Eyes: Negative for pain, discharge, redness, itching and visual disturbance.  Respiratory: Negative for cough, choking, chest tightness, shortness of breath, wheezing and stridor.   Cardiovascular: Negative for chest pain, palpitations and leg swelling.  Gastrointestinal: Negative for abdominal distention.  Genitourinary: Negative for dysuria, urgency, frequency, hematuria, flank pain, decreased urine volume, difficulty urinating and dyspareunia.  Musculoskeletal: Negative for back pain, joint swelling, arthralgias and gait problem.  Neurological: Negative for dizziness, tremors, seizures, syncope, facial asymmetry, speech difficulty, weakness, light-headedness, numbness and  headaches.  Hematological: Negative for adenopathy. Does not bruise/bleed easily.  Psychiatric/Behavioral: Negative for hallucinations, behavioral problems, confusion, dysphoric mood, decreased concentration and agitation.    Objective:   Filed Vitals:   11/06/13 1443  BP: 165/101  Pulse: 84  Temp: 98 F (36.7 C)  Resp: 17    Physical Exam  Constitutional: Appears well-developed and well-nourished. No distress.  HENT: Normocephalic. External right and left ear normal. Oropharynx is clear and moist.  Eyes: Conjunctivae and EOM are normal. PERRLA, no scleral icterus.  Neck: Normal ROM.  Neck supple. No JVD. No tracheal deviation. No thyromegaly.  CVS: RRR, S1/S2 +, no murmurs, no gallops, no carotid bruit.  Pulmonary: Effort and breath sounds normal, no stridor, rhonchi, wheezes, rales.  Abdominal: Soft. BS +,  no distension, tenderness, rebound or guarding.  Musculoskeletal: Normal range of motion. No edema and no tenderness.  Lymphadenopathy: No lymphadenopathy noted, cervical, inguinal. Neuro: Alert. Normal reflexes, muscle tone coordination. No cranial nerve deficit. Skin: Skin is warm and dry. No rash noted. Not diaphoretic. No erythema. No pallor.  Psychiatric: Normal mood and affect. Behavior, judgment, thought content normal.   Lab Results  Component Value Date   WBC 8.7 05/28/2013   HGB 12.5 05/28/2013   HCT 37.1 05/28/2013   MCV 84.3 05/28/2013   PLT 215 05/28/2013   Lab Results  Component Value Date   CREATININE 0.85 08/20/2013   BUN 18 08/20/2013   NA 135 08/20/2013   K 3.5 08/20/2013   CL 95* 08/20/2013   CO2 32 08/20/2013    Lab Results  Component Value Date   HGBA1C 9.0* 08/20/2013   Lipid Panel     Component Value Date/Time   CHOL 147 08/20/2013 1039   TRIG 179* 08/20/2013 1039   HDL 29* 08/20/2013 1039   CHOLHDL 5.1 08/20/2013 1039   VLDL 36 08/20/2013 1039   LDLCALC 82 08/20/2013 1039       Assessment and plan:   Patient Active Problem List   Diagnosis Date Noted  . Diabetes 09/05/2013    Priority: Medium - A1c in September 2014 is 9 indicating poor glycemic control  - Next A1c is due in December 2014. Patient is instructed to come back to recheck A1c at that time.  - For now continue metformin   . HYPERTENSION 11/06/2008    Priority: Medium - We have discussed target BP range - I have advised pt to check BP regularly and to call us back if the numbers are higher than 140/90 - discussed the importance of compliance with medical therapy and diet  - Patient takes blood pressure medications at nighttime. I advised her to take in the morning and she canceled  it can have an accurate estimate of her blood pressure in the morning after her BP meds

## 2013-11-06 NOTE — Patient Instructions (Signed)

## 2013-11-07 ENCOUNTER — Ambulatory Visit: Payer: 59

## 2013-12-04 ENCOUNTER — Ambulatory Visit: Payer: 59 | Attending: Internal Medicine

## 2013-12-04 DIAGNOSIS — IMO0001 Reserved for inherently not codable concepts without codable children: Secondary | ICD-10-CM

## 2013-12-04 LAB — HEMOGLOBIN A1C
Hgb A1c MFr Bld: 7 % — ABNORMAL HIGH (ref <5.7)
Mean Plasma Glucose: 154 mg/dL — ABNORMAL HIGH (ref <117)

## 2013-12-16 ENCOUNTER — Other Ambulatory Visit: Payer: Self-pay

## 2013-12-16 MED ORDER — METFORMIN HCL 500 MG PO TABS: 500.0000 mg | ORAL_TABLET | Freq: Two times a day (BID) | ORAL | Status: DC

## 2013-12-20 ENCOUNTER — Other Ambulatory Visit: Payer: Self-pay | Admitting: Internal Medicine

## 2013-12-20 DIAGNOSIS — Z1231 Encounter for screening mammogram for malignant neoplasm of breast: Secondary | ICD-10-CM

## 2013-12-23 ENCOUNTER — Ambulatory Visit (INDEPENDENT_AMBULATORY_CARE_PROVIDER_SITE_OTHER): Payer: Self-pay | Admitting: Family Medicine

## 2013-12-23 ENCOUNTER — Encounter: Payer: Self-pay | Admitting: Family Medicine

## 2013-12-23 ENCOUNTER — Other Ambulatory Visit (INDEPENDENT_AMBULATORY_CARE_PROVIDER_SITE_OTHER): Payer: Self-pay

## 2013-12-23 VITALS — BP 146/80 | HR 77 | Wt 286.0 lb

## 2013-12-23 DIAGNOSIS — M75100 Unspecified rotator cuff tear or rupture of unspecified shoulder, not specified as traumatic: Secondary | ICD-10-CM

## 2013-12-23 DIAGNOSIS — M25511 Pain in right shoulder: Secondary | ICD-10-CM

## 2013-12-23 DIAGNOSIS — M25519 Pain in unspecified shoulder: Secondary | ICD-10-CM

## 2013-12-23 DIAGNOSIS — IMO0002 Reserved for concepts with insufficient information to code with codable children: Secondary | ICD-10-CM

## 2013-12-23 DIAGNOSIS — M7551 Bursitis of right shoulder: Secondary | ICD-10-CM

## 2013-12-23 DIAGNOSIS — M7550 Bursitis of unspecified shoulder: Secondary | ICD-10-CM | POA: Insufficient documentation

## 2013-12-23 NOTE — Assessment & Plan Note (Signed)
Body mass index is 44.78 kg/(m^2). Patient had injection under ultrasound guidance and did have near complete resolution of pain Home exercise program was given as well as tramadol for nighttime relief. Stay away from anti-inflammatories secondary to allergies ibuprofen. Discussed icing protocol. Patient will follow up again in 3 weeks for further evaluation.

## 2013-12-23 NOTE — Progress Notes (Signed)
Pre-visit discussion using our clinic review tool. No additional management support is needed unless otherwise documented below in the visit note.  

## 2013-12-23 NOTE — Progress Notes (Signed)
CC: Right shoulder pain 6 months  HPI: Patient is a pleasant 58 year old obese female coming in with right shoulder pain. Patient states this pain started approximately 6 months ago. Patient does have past medical history significant for diabetes and hypertension. Patient does not remember any true injury. Patient states that the pain is mostly on the anterior lateral aspect of the shoulder. Hurts her with overhead activity or reaching behind her back. Also wakes her up at night. Describes it as more of a sharp pain that is followed by a dull aching sensation. Patient is a severity a 7/10 denies any numbness or radiation.   Past medical, surgical, family and social history reviewed. Medications reviewed all in the electronic medical record.   Review of Systems: No headache, visual changes, nausea, vomiting, diarrhea, constipation, dizziness, abdominal pain, skin rash, fevers, chills, night sweats, weight loss, swollen lymph nodes, body aches, joint swelling, muscle aches, chest pain, shortness of breath, mood changes.   Objective:    Weight 286 lb (129.729 kg), last menstrual period 05/27/2013.   General: No apparent distress alert and oriented x3 mood and affect normal, dressed appropriately. Obese HEENT: Pupils equal, extraocular movements intact Respiratory: Patient's speak in full sentences and does not appear short of breath Cardiovascular: No lower extremity edema, non tender, no erythema Skin: Warm dry intact with no signs of infection or rash on extremities or on axial skeleton. Abdomen: Soft nontender Neuro: Cranial nerves II through XII are intact, neurovascularly intact in all extremities with 2+ DTRs and 2+ pulses. Lymph: No lymphadenopathy of posterior or anterior cervical chain or axillae bilaterally.  Gait normal with good balance and coordination.  MSK: Non tender with full range of motion and good stability and symmetric strength and tone of  elbows, wrist, hip, knee and  ankles bilaterally.  Shoulder: Right Inspection reveals no abnormalities, atrophy or asymmetry. Palpation is normal with no tenderness over AC joint or bicipital groove. ROM is full in all planes. But does have pain greater than 160 of forward flexion as well as internal rotation to sacrum Rotator cuff strength normal throughout. Positive signs of impingement with negative Neer and Hawkin's tests, empty can sign. Speeds and Yergason's tests normal. No labral pathology noted with negative Obrien's, negative clunk and good stability. Normal scapular function observed. No painful arc and no drop arm sign. No apprehension sign  MSK US performed of: Right shoulder This study was ordered, performed, and interpreted by Terrilee FilesZach Smith D.O. Difficult to assess secondary to patient's body habitus Shoulder:   Supraspinatus:  Patient does have hypoechoic changes in the area but no tear appreciated Infraspinatus:  Appears normal on long and transverse views. Subscapularis:  Appears normal on long and transverse views. Bursitis noted Teres Minor:  Appears normal on long and transverse views. AC joint:  Capsule mildly distended. Glenohumeral Joint:  Appears normal without effusion. Mild thickness of the posterior capsule Glenoid Labrum:  Intact without visualized tears. Biceps Tendon:  Appears normal on long and transverse views, no fraying of tendon, tendon located in intertubercular groove, no subluxation with shoulder internal or external rotation. No increased power doppler signal.  Impression: Subacromial bursitis  Procedure: Real-time Ultrasound Guided Injection of right glenohumeral joint Device: GE Logiq E  Ultrasound guided injection is preferred based studies that show increased duration, increased effect, greater accuracy, decreased procedural pain, increased response rate with ultrasound guided versus blind injection.  Verbal informed consent obtained.  Time-out conducted.  Noted no  overlying erythema, induration, or other signs  of local infection.  Skin prepped in a sterile fashion.  Local anesthesia: Topical Ethyl chloride.  With sterile technique and under real time ultrasound guidance:  Joint visualized.  23g 1  inch needle inserted posterior approach. Pictures taken for needle placement. Patient did have injection of 2 cc of 1% lidocaine, 2 cc of 0.5% Marcaine, and 1.0 cc of Kenalog 40 mg/dL. Completed without difficulty  Pain immediately resolved suggesting accurate placement of the medication.  Advised to call if fevers/chills, erythema, induration, drainage, or persistent bleeding.  Images permanently stored and available for review in the ultrasound unit.  Impression: Technically successful ultrasound guided injection.   Impression and Recommendations:     This case required medical decision making of moderate complexity.

## 2013-12-23 NOTE — Patient Instructions (Signed)
Very nice to meet you Try the exercises most days of the week.  Tramadol at night if needed.  Ice 20 minutes 2 times a day can help Come back again in 3 weeks.

## 2013-12-25 ENCOUNTER — Telehealth: Payer: Self-pay

## 2013-12-25 ENCOUNTER — Telehealth: Payer: Self-pay | Admitting: Internal Medicine

## 2013-12-25 MED ORDER — AZITHROMYCIN 250 MG PO TABS: ORAL_TABLET | ORAL | Status: DC

## 2013-12-25 NOTE — Telephone Encounter (Signed)
Patient called stating she is not feeling well Coughing up thick yellow mucous Spoke with Dr. Hyman Hopesjegede z-pack 500mg  today and than 250mg  x 5 days Sent to [facility] on [address]

## 2013-12-25 NOTE — Telephone Encounter (Signed)
Pt. Is is coughing up mucus,chest congestion, and slight fever. Patient would like to to speak to nurse and see if a medication can be called in.

## 2013-12-27 ENCOUNTER — Ambulatory Visit (HOSPITAL_COMMUNITY)
Admission: RE | Admit: 2013-12-27 | Discharge: 2013-12-27 | Disposition: A | Discharge status: Home / Self Care | Payer: 59 | Source: Ambulatory Visit | Attending: Internal Medicine | Admitting: Internal Medicine

## 2013-12-27 DIAGNOSIS — Z1231 Encounter for screening mammogram for malignant neoplasm of breast: Secondary | ICD-10-CM

## 2014-01-01 ENCOUNTER — Encounter: Payer: 59 | Attending: Internal Medicine | Admitting: Dietician

## 2014-01-01 VITALS — Ht 68.0 in | Wt 282.4 lb

## 2014-01-01 DIAGNOSIS — E119 Type 2 diabetes mellitus without complications: Secondary | ICD-10-CM | POA: Insufficient documentation

## 2014-01-01 DIAGNOSIS — Z713 Dietary counseling and surveillance: Secondary | ICD-10-CM | POA: Insufficient documentation

## 2014-01-01 NOTE — Progress Notes (Signed)
Appt start time: 915 end time:  945.   Assessment:  Cynthia Kemp returns for a follow up today and her Hgb A1c is down to 7.0% from 9.0% and weight is down 3 lbs. Fasting blood glucose averaging 116-128 mg/dl lately. Had been feeling better and then got sick recently, though overall feeling better than before. Was walking in the evening. Gave up sweets, which was the biggest change she made.    Wt Readings from Last 3 Encounters:  01/01/14 282 lb 6.4 oz (128.096 kg)  12/23/13 286 lb (129.729 kg)  10/03/13 285 lb (129.275 kg)   Ht Readings from Last 3 Encounters:  01/01/14 5\' 8"  (1.727 m)  10/03/13 5\' 7"  (1.702 m)  10/01/13 5\' 8"  (1.727 m)   Body mass index is 42.95 kg/(m^2). @BMIFA @ Normalized weight-for-age data available only for age 13 to 20 years. Normalized stature-for-age data available only for age 13 to 20 years.   Current HbA1c: 7.0 on 12/04/2014   MEDICATIONS: metformin (takes at lunch and at bedtime)   DIETARY INTAKE:  24-hr recall:  B ( AM):peanut butter on bread or mozzarella or cheerios with skim milk and sugar with coffee with coffee mate and sugar Snk ( AM): crackers and peanut butter or rice cakes or low sugar graham crackers or apple juice  L ( PM): tuna with miracle whip or meatloaf and potatoes, lettuce salad with coffee, water, or apple juice with  Snk ( PM): bowl of cheerios D ( PM): hamburgers, chicken, cereal, soup Snk ( PM): mozzarrella, cheerios Beverages: water, coffee, apple juice  Usual physical activity: planning to start back working out at the gym, was walking 30 min 1-2 x week  Estimated energy needs: 1600 calories 180 g carbohydrates 120 g protein 44 g fat  Progress Towards Goal(s):  In progress.   Nutritional Diagnosis:  [place]-2.1 Inpaired nutrition utilization As related to glucose metabolism.  As evidenced by Hgb A1c of 9.0%.     Intervention:  Nutrition counseling provided.  Discussed diabetes disease process and treatment options.   Discussed physiology of diabetes and role of obesity on insulin resistance.  Encouraged moderate weight reduction to improve glucose levels.  Discussed role of medications and diet in glucose control  Provided education on macronutrients on glucose levels.  Provided education on carb counting, importance of regularly scheduled meals/snacks, and meal planning  Discussed effects of physical activity on glucose levels and long-term glucose control.  Recommended 150 minutes of physical activity/week.  Reviewed patient medications.  Discussed role of medication on blood glucose and possible side effects  Discussed blood glucose monitoring and interpretation.  Discussed recommended target ranges and individual ranges.    Described short-term complications: hyper- and hypo-glycemia.  Discussed causes,symptoms, and treatment options.  Discussed prevention, detection, and treatment of long-term complications.  Discussed the role of prolonged elevated glucose levels on body systems.  Discussed role of stress on blood glucose levels and discussed strategies to manage psychosocial issues.  Discussed recommendations for long-term diabetes self-care.  Established checklist for medical, dental, and emotional self-care.  Handouts given during visit include:  Living Well With Diabetes  Yellow Card  My Plate  16X15g CHO Snacks  Barriers to learning/adherance to lifestyle change: limited financial resources  Diabetes self-care support plan:   [facility] support group  Family (daughter)  Monitoring/Evaluation:  Dietary intake, exercise, and body weight in prn.

## 2014-01-01 NOTE — Patient Instructions (Addendum)
Goals:  Follow Diabetes Meal Plan as instructed  Eat 3 meals and 2 snacks, every 3-5 hrs  Limit carbohydrate intake to 45-60 grams carbohydrate/meal  Limit carbohydrate intake to 15 grams carbohydrate/snack  Add lean protein foods to meals/snacks  Monitor glucose levels as instructed by your doctor  Aim for 30 mins of physical activity daily  Bring food record and glucose log to your next nutrition visit 

## 2014-01-09 ENCOUNTER — Telehealth: Payer: Self-pay | Admitting: Internal Medicine

## 2014-01-09 MED ORDER — LOSARTAN POTASSIUM 100 MG PO TABS: 100.0000 mg | ORAL_TABLET | Freq: Every day | ORAL | Status: DC

## 2014-01-09 NOTE — Telephone Encounter (Signed)
Pt calling about refill for losartan (COZAAR) 100 MG tablet, says that pharmacy has sent request several times.  Please f/u with pt.

## 2014-01-09 NOTE — Telephone Encounter (Signed)
Pt informed she can pick medication Cozaar 100 mg tab up at [facility]

## 2014-01-13 ENCOUNTER — Ambulatory Visit: Payer: 59 | Admitting: Family Medicine

## 2014-01-22 ENCOUNTER — Ambulatory Visit (INDEPENDENT_AMBULATORY_CARE_PROVIDER_SITE_OTHER): Payer: Self-pay | Admitting: Family Medicine

## 2014-01-22 ENCOUNTER — Encounter: Payer: Self-pay | Admitting: Family Medicine

## 2014-01-22 VITALS — BP 136/74 | HR 73 | Temp 97.3°F | Resp 18 | Wt 282.0 lb

## 2014-01-22 DIAGNOSIS — M7500 Adhesive capsulitis of unspecified shoulder: Secondary | ICD-10-CM

## 2014-01-22 DIAGNOSIS — M7501 Adhesive capsulitis of right shoulder: Secondary | ICD-10-CM | POA: Insufficient documentation

## 2014-01-22 MED ORDER — TRAMADOL HCL 50 MG PO TABS: 50.0000 mg | ORAL_TABLET | Freq: Three times a day (TID) | ORAL | Status: DC | PRN

## 2014-01-22 NOTE — Progress Notes (Signed)
CC: Right shoulder pain 6 months  HPI: Patient is a pleasant 58 year old obese female coming in for follow up of right shoulder pain. Patient was diagnosed with more of a subacromial bursitis and did have a ultrasound guided injection at last visit. Patient states that the pain has improved from previous exam proportionally she's notices that she has had decreased range of motion. Patient states has been harder to reach above her head and has found it difficult to put on her bra. Patient states it is very painful to lay on that side as well.   Past medical, surgical, family and social history reviewed. Medications reviewed all in the electronic medical record.   Review of Systems: No headache, visual changes, nausea, vomiting, diarrhea, constipation, dizziness, abdominal pain, skin rash, fevers, chills, night sweats, weight loss, swollen lymph nodes, body aches, joint swelling, muscle aches, chest pain, shortness of breath, mood changes.   Objective:    Blood pressure 136/74, pulse 73, temperature 97.3 F (36.3 C), temperature source Oral, resp. rate 18, weight 282 lb (127.914 kg), last menstrual period 05/27/2013, SpO2 98.00%.   General: No apparent distress alert and oriented x3 mood and affect normal, dressed appropriately. Obese HEENT: Pupils equal, extraocular movements intact Respiratory: Patient's speak in full sentences and does not appear short of breath Cardiovascular: No lower extremity edema, non tender, no erythema Skin: Warm dry intact with no signs of infection or rash on extremities or on axial skeleton. Abdomen: Soft nontender Neuro: Cranial nerves II through XII are intact, neurovascularly intact in all extremities with 2+ DTRs and 2+ pulses. Lymph: No lymphadenopathy of posterior or anterior cervical chain or axillae bilaterally.  Gait normal with good balance and coordination.  MSK: Non tender with full range of motion and good stability and symmetric strength and tone of   elbows, wrist, hip, knee and ankles bilaterally.  Shoulder: Right Inspection reveals no abnormalities, atrophy or asymmetry. Palpation is normal with no tenderness over AC joint or bicipital groove. ROM is decreased compared to previous exam with forward flexion of 150 passively, internal rotation to lateral hip, and external rotation of 10. Rotator cuff strength normal throughout. Positive signs of impingement with negative Neer and Hawkin's tests, empty can sign. Speeds and Yergason's tests normal. No labral pathology noted with negative Obrien's, negative clunk and good stability. Normal scapular function observed. No painful arc and no drop arm sign. No apprehension sign Contralateral shoulder unremarkable  MSK US performed of: Right shoulder This study was ordered, performed, and interpreted by Terrilee Files D.O. Difficult to assess secondary to patient's body habitus Shoulder:   Supraspinatus:  Patient does have hypoechoic changes in the area but no tear appreciated Infraspinatus:  Appears normal on long and transverse views. Subscapularis:  Appears normal on long and transverse views. Bursitis noted Teres Minor:  Appears normal on long and transverse views. AC joint:  Capsule mildly distended. Glenohumeral Joint:  Appears normal without effusion. Significant thickness of the posterior capsule compared to previous exam Glenoid Labrum:  Intact without visualized tears. Biceps Tendon:  Appears normal on long and transverse views, no fraying of tendon, tendon located in intertubercular groove, no subluxation with shoulder internal or external rotation. No increased power doppler signal.  Impression: Adhesive capsulitis  Procedure: Real-time Ultrasound Guided Injection of right glenohumeral joint Device: GE Logiq E  Ultrasound guided injection is preferred based studies that show increased duration, increased effect, greater accuracy, decreased procedural pain, increased response  rate with ultrasound guided versus blind injection.  Verbal informed consent obtained.  Time-out conducted.  Noted no overlying erythema, induration, or other signs of local infection.  Skin prepped in a sterile fashion.  Local anesthesia: Topical Ethyl chloride.  With sterile technique and under real time ultrasound guidance:  Joint visualized.  23g 1  inch needle inserted posterior approach. Pictures taken for needle placement. Patient did have injection of 3 cc of 1% lidocaine, 3 cc of 0.5% Marcaine, and 1.0 cc of Kenalog 40 mg/dL. Completed without difficulty  Pain immediately resolved suggesting accurate placement of the medication.  Advised to call if fevers/chills, erythema, induration, drainage, or persistent bleeding.  Images permanently stored and available for review in the ultrasound unit.  Impression: Technically successful ultrasound guided injection.   Impression and Recommendations:     This case required medical decision making of moderate complexity.

## 2014-01-22 NOTE — Assessment & Plan Note (Signed)
Patient does have what appears to be adhesive capsulitis. Patient's ultrasound as well as physical exam corresponds him as well. The patient given new exercises and will be sent to formal physical therapy. The patient was given an injection today with higher amount of fluid which we will help break some of the pieces. Discussed the prognosis is likely that this will take months unfortunately. Patient will come back again in 4 weeks for further evaluation.

## 2014-01-22 NOTE — Progress Notes (Signed)
Pre-visit discussion using our clinic review tool. No additional management support is needed unless otherwise documented below in the visit note.  

## 2014-01-22 NOTE — Patient Instructions (Signed)
Good to see you again Lets start physical therapy for your frozen shoulder. They will call you Tramadol up to twice daily as needed for pain Ice 20 minutes 2 times a day Try to do some of the exercises most days of the week Come back again in 4 weeks.

## 2014-02-12 ENCOUNTER — Other Ambulatory Visit: Payer: Self-pay | Admitting: Endocrinology

## 2014-02-19 ENCOUNTER — Ambulatory Visit: Payer: 59 | Admitting: Family Medicine

## 2014-05-29 ENCOUNTER — Other Ambulatory Visit: Payer: Self-pay | Admitting: Internal Medicine

## 2014-05-29 ENCOUNTER — Other Ambulatory Visit: Payer: Self-pay | Admitting: Endocrinology

## 2014-08-29 ENCOUNTER — Other Ambulatory Visit: Payer: Self-pay | Admitting: Endocrinology

## 2014-08-30 ENCOUNTER — Other Ambulatory Visit: Payer: Self-pay | Admitting: Internal Medicine

## 2014-09-07 ENCOUNTER — Other Ambulatory Visit: Payer: Self-pay | Admitting: Internal Medicine

## 2014-09-07 ENCOUNTER — Other Ambulatory Visit: Payer: Self-pay | Admitting: Endocrinology

## 2014-09-24 ENCOUNTER — Other Ambulatory Visit: Payer: Self-pay | Admitting: Internal Medicine

## 2014-10-13 ENCOUNTER — Other Ambulatory Visit: Payer: Self-pay | Admitting: Internal Medicine

## 2014-10-14 ENCOUNTER — Other Ambulatory Visit: Payer: Self-pay | Admitting: *Deleted

## 2014-10-14 MED ORDER — AMLODIPINE BESYLATE 5 MG PO TABS: 5.0000 mg | ORAL_TABLET | Freq: Every day | ORAL | Status: DC

## 2014-10-14 NOTE — Progress Notes (Unsigned)
Request to refill medication. I reordered medication sent to her pharmacy.

## 2014-12-13 ENCOUNTER — Other Ambulatory Visit: Payer: Self-pay | Admitting: Endocrinology

## 2014-12-15 ENCOUNTER — Other Ambulatory Visit: Payer: Self-pay | Admitting: *Deleted

## 2014-12-15 MED ORDER — METFORMIN HCL 500 MG PO TABS: 500.0000 mg | ORAL_TABLET | Freq: Two times a day (BID) | ORAL | Status: DC

## 2015-01-07 ENCOUNTER — Other Ambulatory Visit: Payer: Self-pay | Admitting: Endocrinology

## 2015-01-07 NOTE — Telephone Encounter (Signed)
Rx sent 

## 2015-01-07 NOTE — Telephone Encounter (Signed)
Please refill x 1 Ov is due  

## 2015-01-07 NOTE — Telephone Encounter (Signed)
Please advise if ok to refill. Pt has not been seen since 11/26/2013.  Thanks!

## 2015-06-15 ENCOUNTER — Other Ambulatory Visit: Payer: Self-pay

## 2016-07-28 ENCOUNTER — Ambulatory Visit (INDEPENDENT_AMBULATORY_CARE_PROVIDER_SITE_OTHER): Payer: Self-pay | Admitting: Family Medicine

## 2016-07-28 ENCOUNTER — Ambulatory Visit: Payer: Self-pay

## 2016-07-28 ENCOUNTER — Encounter: Payer: Self-pay | Admitting: Family Medicine

## 2016-07-28 VITALS — BP 136/84 | HR 76 | Ht 67.75 in | Wt 280.0 lb

## 2016-07-28 DIAGNOSIS — M1711 Unilateral primary osteoarthritis, right knee: Secondary | ICD-10-CM

## 2016-07-28 DIAGNOSIS — M25561 Pain in right knee: Secondary | ICD-10-CM

## 2016-07-28 NOTE — Patient Instructions (Addendum)
Good to see you  Ice 20 minutes 2 times daily. Usually after activity and before bed. pennsaid pinkie amount topically 2 times daily as needed. Watch out for swelling.  Injected the knee today and I hope it helps You will need a brace and other injections at some point.  Tylenol 500mg  3 times a day  Turmeric 500mg  daily  Tart cherry extract any dose at night Good shoes with rigid bottom.  Dierdre HarnessKeen, Dansko, Merrell or New balance greater then 700 Spenco orthotics "total support" online would be great  Any pound you lose will be 4 pounds to your knee.  Recumbent bike or elliptical for exercise.  I can repeat injection every 3 months  See me when you can

## 2016-07-28 NOTE — Assessment & Plan Note (Signed)
Discussed with patient at great length. Patient home exercises, icing protocol, topical anti-inflammatories and warned of potential side effects with her having an allergy to ibuprofen. We discussed over-the-counter medications secondary to patient not having any type of insurance at the moment. Encourage her to potentially get insurance that she will likely need custom bracing as well as viscous supplementation due to the severity of the knee. I also believe the patient will need a joint replacement at some point. Patient will come back in 6 weeks for further evaluation and we'll discuss other modalities at possible.

## 2016-07-28 NOTE — Progress Notes (Signed)
[name] [address] [place], [place] [address] Phone: [phone] Subjective:    I'm seeing this patient by the request  of:  JEGEDE, OLUGBEMIGA, MD   CC: right knee pain  YQM:[id number]  Cynthia Kemp is a 60 y.o. female coming in with complaint of right knee pain. Patient has been told years ago that she didn't have significant arthritis in the knee. Patient had been doing relatively well until recently. Having increasing instability of the knee. Rates the severity of pain is 8 out of 10. States that going up and down stairs is very difficult. Feels like she is unstable. Think she may have some swelling. Patient is looking for conservative therapy with her not having health insurance at the moment.     Past Medical History:  Diagnosis Date  . Diabetes mellitus without complication (HCC)   . Hypertension   . Psoriasis    Past Surgical History:  Procedure Laterality Date  . ABDOMINAL HYSTERECTOMY    . APPENDECTOMY    . EYE SURGERY    . TONSILLECTOMY    . TONSILLECTOMY    . TUBAL LIGATION     Social History   Social History  . Marital status: Divorced    Spouse name: N/A  . Number of children: N/A  . Years of education: N/A   Social History Main Topics  . Smoking status: Never Smoker  . Smokeless tobacco: None  . Alcohol use No  . Drug use: No  . Sexual activity: Not Asked   Other Topics Concern  . None   Social History Narrative  . None   Allergies  Allergen Reactions  . Ibuprofen     REACTION: face swells  . Mushroom Extract Complex   . Penicillins     unknown   Family History  Problem Relation Age of Onset  . Hypertension Mother   . Hypertension Brother   . Asthma Other   . COPD Other   . Hypertension Other   . Depression Other   . Stroke Other   . Heart disease Other     Past medical history, social, surgical and family history all reviewed in electronic medical record.  No pertanent information unless  stated regarding to the chief complaint.   Review of Systems: No headache, visual changes, nausea, vomiting, diarrhea, constipation, dizziness, abdominal pain, skin rash, fevers, chills, night sweats, weight loss, swollen lymph nodes, body aches, joint swelling, muscle aches, chest pain, shortness of breath, mood changes.   Objective  Blood pressure 136/84, pulse 76, height 5' 7.75" (1.721 m), weight 280 lb (127 kg), last menstrual period 05/27/2013, SpO2 97 %.  General: No apparent distress alert and oriented x3 mood and affect normal, dressed appropriately. Morbid obesity HEENT: Pupils equal, extraocular movements intact  Respiratory: Patient's speak in full sentences and does not appear short of breath  Cardiovascular: No lower extremity edema, non tender, no erythema  Skin: Warm dry intact with no signs of infection or rash on extremities or on axial skeleton.  Abdomen: Soft nontender  Neuro: Cranial nerves II through XII are intact, neurovascularly intact in all extremities with 2+ DTRs and 2+ pulses.  Lymph: No lymphadenopathy of posterior or anterior cervical chain or axillae bilaterally.  Gait normal with good balance and coordination.  MSK:  Non tender with full range of motion and good stability and symmetric strength and tone of shoulders, elbows, wrist, hip, and ankles bilaterally. Difficult to assess secondary to patient's body  habitus  Knee:right knee Difficult to assess secondary to body habitus Tender to palpation over the medial joint line Lacks last 5 of extension and the last 10 of flexion Significant laxity with valgus and varus force Negative Mcmurray's, Apley's, and Thessalonian tests. Non painful patellar compression. Patellar glide with moderatecrepitus. Patellar and quadriceps tendons unremarkable. Hamstring and quadriceps strength is normal.  Contralateral knee unremarkable Contralateral knee has some crepitus and mild pain over the medial joint line but not as  severe as the contralateral side.  MSK US performed of: right knee This study was ordered, performed, and interpreted by Terrilee FilesZach Smith D.O.  Knee: All structures visualized. Significant narrowing with bone-on-bone osteophytic changes of the medial joint line Significant degenerative changes of the LCL Significant arthritic changes of the patellofemoral joint Patellar Tendon unremarkable on long and transverse views without effusion. No abnormality of prepatellar bursa.   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 Right knee was prepped with alcohol swab and utilizing anterolateral approach, patient's right knee space was injected with 4:1  marcaine 0.5%: Kenalog 40mg /dL. Patient tolerated the procedure well without immediate complications.    Impression and Recommendations:     This case required medical decision making of moderate complexity.      Note: This dictation was prepared with Dragon dictation along with smaller phrase technology. Any transcriptional errors that result from this process are unintentional.

## 2017-04-18 ENCOUNTER — Telehealth: Payer: Self-pay

## 2017-04-18 NOTE — Telephone Encounter (Signed)
SENT NOTES TO SCHEDULING 

## 2017-04-24 ENCOUNTER — Telehealth: Payer: Self-pay | Admitting: Cardiovascular Disease

## 2017-04-24 NOTE — Telephone Encounter (Signed)
Received records from [facility] for appointment on 05/02/17 with Dr Tresa EndoKelly.  Records put with Dr Landry DykeKelly's schedule for 05/02/17. lp

## 2017-04-27 ENCOUNTER — Other Ambulatory Visit: Payer: Self-pay

## 2017-05-02 ENCOUNTER — Ambulatory Visit (INDEPENDENT_AMBULATORY_CARE_PROVIDER_SITE_OTHER): Payer: Self-pay | Admitting: Cardiovascular Disease

## 2017-05-02 ENCOUNTER — Encounter: Payer: Self-pay | Admitting: Cardiovascular Disease

## 2017-05-02 VITALS — BP 153/94 | HR 73 | Ht 67.5 in | Wt 275.6 lb

## 2017-05-02 DIAGNOSIS — E119 Type 2 diabetes mellitus without complications: Secondary | ICD-10-CM

## 2017-05-02 DIAGNOSIS — R42 Dizziness and giddiness: Secondary | ICD-10-CM

## 2017-05-02 DIAGNOSIS — R0602 Shortness of breath: Secondary | ICD-10-CM

## 2017-05-02 DIAGNOSIS — I10 Essential (primary) hypertension: Secondary | ICD-10-CM

## 2017-05-02 DIAGNOSIS — E785 Hyperlipidemia, unspecified: Secondary | ICD-10-CM

## 2017-05-02 MED ORDER — ROSUVASTATIN CALCIUM 10 MG PO TABS: 10.0000 mg | ORAL_TABLET | Freq: Every day | ORAL | 11 refills | Status: DC

## 2017-05-02 MED ORDER — VALSARTAN 80 MG PO TABS: 80.0000 mg | ORAL_TABLET | Freq: Every day | ORAL | 11 refills | Status: DC

## 2017-05-02 NOTE — Patient Instructions (Signed)
medication changes  Stop pravastatin   Start crestor ( rosuvastatin) 10 mg one tablet at bedtime  Increase valsartan to 80 mg one tablet daily     In 2 months -July 2018 cmp  Lipid Do not eat or drink the morning of the test    Schedule at [facility] Your physician has requested that you have an echocardiogram. Echocardiography is a painless test that uses sound waves to create images of your heart. It provides your doctor with information about the size and shape of your heart and how well your heart's chambers and valves are working. This procedure takes approximately one hour. There are no restrictions for this procedure.    Your physician recommends that you schedule a follow-up appointment in 3 months with DR KELLY.

## 2017-05-02 NOTE — Progress Notes (Signed)
Cardiology Office Note    Date:  05/08/2017   ID:  Cynthia Kemp, DOB Jul 03, 1956, MRN [id number]  PCP:  Cynthia Pepper, MD  Cardiologist:  Cynthia Majestic, MD   Chief Complaint  Patient presents with  . New Patient (Initial Visit)   Cardiology consultation: Referring M.D. Dr. London Kemp History of Present Illness:  Cynthia Kemp is a 61 y.o. female who is referred for cardiology consultation through the courtesy of Dr. America Kemp for evaluation of fatigue, shortness of breath, and vague chest pain.  [name] has a history of type 2 diabetes mellitus, hypertension, migraines, hyperlipidemia, and recently has developed shortness of breath with walking and lightheadedness with exertion.  She tells me that in 2000.  She had undergone a cardiac catheterization and was told of having normal coronary arteries.  She has been on pravastatin for hyperlipidemia.  With her history of diabetes mellitus and hypertension.  She is now referred for cardiology consultation.  She typically notices the shortness of breath when she walks up steps.  She denies chest tightness.  Recent laboratory on 4:30/2018 showed a glucose of 1:30.  She had normal renal function.  Cholesterol was 170 with an LDL cholesterol at 107.  Hemoglobin A1c was 6.4.  TSH was normal.  She has been on pravastatin 10 mg for hyperlipidemia and valsartan 40 mg and HCTZ 25 mg for hypertension.  She is on metformin 1000 mg twice a day for her diabetes mellitus.  She has a history of obesity, and states that over the past 10 years she has gained approximately 100 pounds.  She presents for evaluation.  Past Medical History:  Diagnosis Date  . Diabetes mellitus without complication ([place])   . Hypertension   . Psoriasis     Past Surgical History:  Procedure Laterality Date  . ABDOMINAL HYSTERECTOMY    . APPENDECTOMY    . EYE SURGERY    . TONSILLECTOMY    . TONSILLECTOMY    . TUBAL LIGATION      Current Medications: Outpatient  Medications Prior to Visit  Medication Sig Dispense Refill  . glucose blood (TRUETEST TEST) test strip And lancets 1/day 100 each 3  . hydrochlorothiazide (HYDRODIURIL) 25 MG tablet Take 25 mg by mouth daily.  1  . KLOR-CON M20 20 MEQ tablet Take 20 mEq by mouth daily.  1  . metFORMIN (GLUCOPHAGE) 1000 MG tablet Take 1,000 mg by mouth 2 (two) times daily with a meal.  1  . triamcinolone cream (KENALOG) 0.1 % Apply 1 application topically 2 (two) times daily. For 1-2 weeks  0  . TRUEPLUS LANCETS 33G MISC 1 each by Does not apply route as directed. 100 each 3  . pravastatin (PRAVACHOL) 10 MG tablet Take 10 mg by mouth daily.  5  . valsartan (DIOVAN) 40 MG tablet Take 40 mg by mouth daily.  1   No facility-administered medications prior to visit.      Allergies:   Ibuprofen; Mushroom extract complex; and Penicillins   Social History   Social History  . Marital status: Divorced    Spouse name: N/A  . Number of children: N/A  . Years of education: N/A   Social History Main Topics  . Smoking status: Never Smoker  . Smokeless tobacco: Never Used  . Alcohol use No  . Drug use: No  . Sexual activity: Not on file   Other Topics Concern  . Not on file   Social History Narrative  .  No narrative on file    Socially she is divorced for 29 years.  She has 2 children.  She lives at home with her son and daughter.  She had completed a masters degree and a law degree.  She graduated from [employer]. law school.  She is retired.  There is no history of tobacco use.  She recently has not exercised, but in the past had done recumbent bike and treadmill.  Family History:  The patient's family history includes Asthma in her other; COPD in her other; Depression in her other; Heart disease in her other; Hypertension in her brother, mother, and other; Stroke in her other.  Her mother died at age 63 years.  Her father still living at 58.  A brother died at age 67 with diabetes, pancreatitis.  Her  sister is 80 and has hypertension and scoliosis.  ROS General: Negative; No fevers, chills, or night sweats; positive for 100 weight gain over 10 years HEENT: Negative; No changes in vision or hearing, sinus congestion, difficulty swallowing Pulmonary: Negative; No cough, wheezing, shortness of breath, hemoptysis Cardiovascular: See history of present illness GI: Negative; No nausea, vomiting, diarrhea, or abdominal pain GU: Negative; No dysuria, hematuria, or difficulty voiding Musculoskeletal: Negative; no myalgias, joint pain, or weakness Hematologic/Oncology: Negative; no easy bruising, bleeding Endocrine: Positive for diabetes mellitus Neuro: Positive for migraine headaches Skin: Positive for psoriasis Psychiatric: Negative; No behavioral problems, depression Sleep: Negative; No snoring, daytime sleepiness, hypersomnolence, bruxism, restless legs, hypnogognic hallucinations, no cataplexy Other comprehensive 14 point system review is negative.   PHYSICAL EXAM:   VS:  BP (!) 153/94   Pulse 73   Ht 5' 7.5" (1.715 m)   Wt 275 lb 9.6 oz (125 kg)   LMP 05/27/2013   BMI 42.53 kg/m    Morbidly obese Repeat blood pressure by me 154/90 Wt Readings from Last 3 Encounters:  05/02/17 275 lb 9.6 oz (125 kg)  07/28/16 280 lb (127 kg)  01/22/14 282 lb (127.9 kg)    General: Alert, oriented, no distress.  Skin: normal turgor, no rashes, warm and dry HEENT: Normocephalic, atraumatic. Pupils equal round and reactive to light; sclera anicteric; extraocular muscles intact; Fundi Mild increase vessel tortuosity.  No hemorrhages or exudates.  Disc flat. Nose without nasal septal hypertrophy Mouth/Parynx benign; Mallinpatti scale 3 Neck: No JVD, no carotid bruits; normal carotid upstroke Lungs: clear to ausculatation and percussion; no wheezing or rales Chest wall: without tenderness to palpitation Heart: PMI not displaced, RRR, s1 s2 normal, 1/6 systolic murmur, no diastolic murmur, no rubs,  gallops, thrills, or heaves Abdomen: soft, nontender; no hepatosplenomehaly, BS+; abdominal aorta nontender and not dilated by palpation. Back: no CVA tenderness Pulses 2+ Musculoskeletal: full range of motion, normal strength, no joint deformities Extremities: no clubbing cyanosis or edema, Homan's sign negative  Neurologic: grossly nonfocal; Cranial nerves grossly wnl Psychologic: Normal mood and affect   Studies/Labs Reviewed:   EKG:  EKG is ordered today.  ECG (independently read by me): Normal sinus rhythm at 73 bpm.  Non-specific ST changes inferiorly.  Normal intervals  Recent Labs:  I personally reviewed the laboratory done at [facility] from 04/17/2017 as noted above.  BMP Latest Ref Rng & Units 08/20/2013 05/28/2013 05/27/2013  Glucose 70 - 99 mg/dL 330(H) 253(H) 146(H)  BUN 6 - 23 mg/dL 18 15 16   Creatinine 0.50 - 1.10 mg/dL 0.85 0.70 0.68  Sodium 135 - 145 mEq/L 135 133(L) 135  Potassium 3.5 - 5.3 mEq/L 3.5  3.9 4.1  Chloride 96 - 112 mEq/L 95(L) 96 99  CO2 19 - 32 mEq/L 32 28 27  Calcium 8.4 - 10.5 mg/dL 9.4 9.3 9.5     Hepatic Function Latest Ref Rng & Units 09/05/2013 05/27/2013  Total Protein 6.0 - 8.3 g/dL 7.1 7.8  Albumin 3.5 - 5.2 g/dL 4.0 4.0  AST 0 - 37 U/L 27 50(H)  ALT 0 - 35 U/L 26 25  Alk Phosphatase 39 - 117 U/L 86 113  Total Bilirubin 0.3 - 1.2 mg/dL 0.7 0.7  Bilirubin, Direct 0.0 - 0.3 mg/dL 0.2 -    CBC Latest Ref Rng & Units 05/28/2013 05/27/2013 10/17/2006  WBC 4.0 - 10.5 K/uL 8.7 8.0 5.6  Hemoglobin 12.0 - 15.0 g/dL 12.5 13.0 12.6  Hematocrit 36.0 - 46.0 % 37.1 37.9 37.1  Platelets 150 - 400 K/uL 215 213 232   Lab Results  Component Value Date   MCV 84.3 05/28/2013   MCV 83.7 05/27/2013   MCV 92.3 10/17/2006   Lab Results  Component Value Date   TSH 3.15 10/17/2006   Lab Results  Component Value Date   HGBA1C 7.0 (H) 12/04/2013     BNP No results found for: BNP  ProBNP    Component Value Date/Time   PROBNP 457.5 (H)  05/27/2013 1945     Lipid Panel     Component Value Date/Time   CHOL 147 08/20/2013 1039   TRIG 179 (H) 08/20/2013 1039   TRIG 79 10/17/2006 0820   HDL 29 (L) 08/20/2013 1039   CHOLHDL 5.1 08/20/2013 1039   VLDL 36 08/20/2013 1039   LDLCALC 82 08/20/2013 1039     RADIOLOGY: No results found.   Additional studies/ records that were reviewed today include:  I reviewed the office records from [facility] as well as laboratory.    ASSESSMENT:    1. Essential hypertension   2. SOB (shortness of breath)   3. Type 2 diabetes mellitus without complication, without long-term current use of insulin ([place])   4. Morbid obesity ([place])   5. Hyperlipidemia LDL goal <70   6. Episodic lightheadedness      PLAN:  Ms. Pennella is a 61 year old female who has a history of hypertension since 2009, type 2 diabetes mellitus since 2014,  hyperlipidemia, and morbid obesity who presents with increasing symptoms of shortness of breath with walking and occasional lightheadedness with exertion.  Remotely, she was found to have normal coronary arteries at cardiac catheterization approximately in 2000.  Remotely, she had been on amlodipine and add some time had also been on atenolol.  She complained that when she was treated with losartan.  This contributed to hair loss.  Most recently she has been on valsartan at just 40 mg for her blood pressure.  Her blood pressure today is elevated at 153/94.  I am  increasing this to 80 mg daily , but she may very well need at least 160 mg daily.  With her diabetes mellitus, target LDL is less than 70 and I recommended that she discontinue pravastatin and change to rosuvastatin 10 mg for more effective lipid-lowering.  I am scheduling her to undergo a 2-D echo Doppler study to evaluate both systolic and diastolic function as well as valvular architecture.  She will undergo repeat laboratory in 2 months.  We discussed her greater than 100 pound weight gain over the past  10 years.  This undoubtedly is contributing to her comorbidities and certainly also places her at risk  for sleep apnea.  I discussed the importance of exercising at least 5 days per week for a minimum of 30 minutes.  I will see her back in the office in 3 months following her laboratory for further evaluation and recommendations.   Medication Adjustments/Labs and Tests Ordered: Current medicines are reviewed at length with the patient today.  Concerns regarding medicines are outlined above.  Medication changes, Labs and Tests ordered today are listed in the Patient Instructions below. Patient Instructions  medication changes  Stop pravastatin   Start crestor ( rosuvastatin) 10 mg one tablet at bedtime  Increase valsartan to 80 mg one tablet daily     In 2 months -July 2018 cmp  Lipid Do not eat or drink the morning of the test    Schedule at [facility] has requested that you have an echocardiogram. Echocardiography is a painless test that uses sound waves to create images of your heart. It provides your doctor with information about the size and shape of your heart and how well your heart's chambers and valves are working. This procedure takes approximately one hour. There are no restrictions for this procedure.    Your physician recommends that you schedule a follow-up appointment in 3 months with DR KELLY.       Signed, Cynthia Majestic, MD, [facility] 05/08/2017 6:37 PM    [facility] [address] [place], [place], [place]  [address] Phone: [phone]

## 2017-05-08 ENCOUNTER — Encounter: Payer: Self-pay | Admitting: Cardiovascular Disease

## 2017-05-17 ENCOUNTER — Ambulatory Visit (HOSPITAL_COMMUNITY): Payer: Self-pay | Attending: Cardiovascular Disease

## 2017-05-17 ENCOUNTER — Other Ambulatory Visit: Payer: Self-pay

## 2017-05-17 VITALS — BP 160/89

## 2017-05-17 DIAGNOSIS — E119 Type 2 diabetes mellitus without complications: Secondary | ICD-10-CM | POA: Insufficient documentation

## 2017-05-17 DIAGNOSIS — R0602 Shortness of breath: Secondary | ICD-10-CM | POA: Insufficient documentation

## 2017-05-17 DIAGNOSIS — I10 Essential (primary) hypertension: Secondary | ICD-10-CM | POA: Insufficient documentation

## 2017-05-17 MED ORDER — PERFLUTREN LIPID MICROSPHERE
1.0000 mL | INTRAVENOUS | Status: AC | PRN
  Administered 2017-05-17: 2 mL via INTRAVENOUS

## 2017-07-06 LAB — COMPREHENSIVE METABOLIC PANEL
ALT: 17 U/L (ref 6–29)
AST: 19 U/L (ref 10–35)
Albumin: 3.9 g/dL (ref 3.6–5.1)
Alkaline Phosphatase: 79 U/L (ref 33–130)
BUN: 20 mg/dL (ref 7–25)
CO2: 27 mmol/L (ref 20–31)
Calcium: 9.2 mg/dL (ref 8.6–10.4)
Chloride: 103 mmol/L (ref 98–110)
Creat: 0.75 mg/dL (ref 0.50–0.99)
Glucose, Bld: 119 mg/dL — ABNORMAL HIGH (ref 65–99)
Potassium: 4.4 mmol/L (ref 3.5–5.3)
Sodium: 140 mmol/L (ref 135–146)
Total Bilirubin: 0.4 mg/dL (ref 0.2–1.2)
Total Protein: 6.4 g/dL (ref 6.1–8.1)

## 2017-07-06 LAB — LIPID PANEL
Cholesterol: 103 mg/dL (ref <200)
HDL: 35 mg/dL — ABNORMAL LOW (ref >50)
LDL Cholesterol: 49 mg/dL (ref <100)
Total CHOL/HDL Ratio: 2.9 Ratio (ref <5.0)
Triglycerides: 95 mg/dL (ref <150)
VLDL: 19 mg/dL (ref <30)

## 2017-08-01 ENCOUNTER — Encounter: Payer: Self-pay | Admitting: Cardiovascular Disease

## 2017-08-01 ENCOUNTER — Ambulatory Visit (INDEPENDENT_AMBULATORY_CARE_PROVIDER_SITE_OTHER): Payer: Self-pay | Admitting: Cardiovascular Disease

## 2017-08-01 VITALS — BP 144/96 | HR 72 | Ht 67.5 in | Wt 277.4 lb

## 2017-08-01 DIAGNOSIS — E785 Hyperlipidemia, unspecified: Secondary | ICD-10-CM

## 2017-08-01 DIAGNOSIS — E119 Type 2 diabetes mellitus without complications: Secondary | ICD-10-CM

## 2017-08-01 DIAGNOSIS — I10 Essential (primary) hypertension: Secondary | ICD-10-CM

## 2017-08-01 MED ORDER — ROSUVASTATIN CALCIUM 10 MG PO TABS: 10.0000 mg | ORAL_TABLET | Freq: Every day | ORAL | 3 refills | Status: AC

## 2017-08-01 MED ORDER — IRBESARTAN 150 MG PO TABS: 150.0000 mg | ORAL_TABLET | Freq: Every day | ORAL | 3 refills | Status: DC

## 2017-08-01 NOTE — Patient Instructions (Addendum)
Your physician has recommended you make the following change in your medication:   1.) start new irbesartan prescription. This replaces the valsartan prescription and [facility].   Your physician wants you to follow-up in: 6 months or sooner if needed. You will receive a reminder letter in the mail two months in advance. If you don't receive a letter, please call our office to schedule the follow-up appointment.  If you need a refill on your cardiac medications before your next appointment, please call your pharmacy.

## 2017-08-01 NOTE — Progress Notes (Signed)
Cardiology Office Note    Date:  08/01/2017   ID:  Cynthia Kemp, DOB 1956-10-10, MRN [id number]  PCP:  London Pepper, MD  Cardiologist:  Shelva Majestic, MD   Chief Complaint  Patient presents with  . Follow-up    pt denied chest pain     History of Present Illness:  Cynthia Kemp is a 61 y.o. female who was referred for cardiology consultation through the courtesy of Dr. America Brown for evaluation of fatigue, shortness of breath, and vague chest pain. I saw her initially in May 2018 and she presents for three-month follow-up evaluation.  Ms. Spells has a history of type 2 diabetes mellitus, hypertension, migraines, hyperlipidemia, and recently has developed shortness of breath with walking and lightheadedness with exertion.  She tells me that in 2000.  She had undergone a cardiac catheterization and was told of having normal coronary arteries.  She has been on pravastatin for hyperlipidemia.  With her history of diabetes mellitus and hypertension.  She is now referred for cardiology consultation.  She typically notices the shortness of breath when she walks up steps.  She denies chest tightness. Laboratory on 04/17/2017 showed a glucose of 130.  She had normal renal function.  Cholesterol was 170 with an LDL cholesterol at 107.  Hemoglobin A1c was 6.4.  TSH was normal.  She has been on pravastatin 10 mg for hyperlipidemia and valsartan 40 mg and HCTZ 25 mg for hypertension.  She is on metformin 1000 mg twice a day for her diabetes mellitus.  She has a history of obesity, and states that over the past 10 years she has gained approximately 100 pounds.    When I initially saw her, she had been on valsartan.  Adjust 40 mg for blood pressure and her blood pressure was elevated.  I titrated valsartan to 80 mg daily.  With her diabetes mellitus, her target LDL is less than 70 and I recommended she discontinue pravastatin and changed her to rosuvastatin for more effective lipid-lowering.  I  scheduled her for an echo Doppler study which was done on 05/17/2017.  This revealed normal systolic and diastolic function.  Her left atrium was moderately dilated.  Subsequent blood work has shown significant improvement in her lipid studies with atenolol.  Total cholesterol improved to 103, and LDL cholesterol at 49.  HDL was 35.  She was recently notified that her manufacturer of valsartan was Her, which has just been implicated as well.  The companies that may have in purities in the valsartan powder.  For this reason, I will discontinue valsartan and will switch her to irbesartan 150 mg.  She will monitor her blood pressure.  BMI is 42.8 which is consistent with morbid obesity.  Weight loss and exercise was strongly recommended with exercise at least 5 days per week for a minimum of 30 minutes if at all possible.  I will see her in 6 months for cardiology reevaluation.  Past Medical History:  Diagnosis Date  . Diabetes mellitus without complication ([place])   . Hypertension   . Psoriasis     Past Surgical History:  Procedure Laterality Date  . ABDOMINAL HYSTERECTOMY    . APPENDECTOMY    . EYE SURGERY    . TONSILLECTOMY    . TONSILLECTOMY    . TUBAL LIGATION      Current Medications: Outpatient Medications Prior to Visit  Medication Sig Dispense Refill  . aspirin 81 MG tablet Take 81 mg by mouth daily.    Marland Kitchen  glucose blood (TRUETEST TEST) test strip And lancets 1/day 100 each 3  . hydrochlorothiazide (HYDRODIURIL) 25 MG tablet Take 25 mg by mouth daily.  1  . KLOR-CON M20 20 MEQ tablet Take 20 mEq by mouth daily.  1  . metFORMIN (GLUCOPHAGE) 1000 MG tablet Take 1,000 mg by mouth 2 (two) times daily with a meal.  1  . rosuvastatin (CRESTOR) 10 MG tablet Take 1 tablet (10 mg total) by mouth daily. 30 tablet 11  . triamcinolone cream (KENALOG) 0.1 % Apply 1 application topically 2 (two) times daily. For 1-2 weeks  0  . TRUEPLUS LANCETS 33G MISC 1 each by Does not apply route as directed.  100 each 3  . valsartan (DIOVAN) 80 MG tablet Take 1 tablet (80 mg total) by mouth daily. 30 tablet 11   No facility-administered medications prior to visit.      Allergies:   Ibuprofen; Mushroom extract complex; and Penicillins   Social History   Social History  . Marital status: Divorced    Spouse name: N/A  . Number of children: N/A  . Years of education: N/A   Social History Main Topics  . Smoking status: Never Smoker  . Smokeless tobacco: Never Used  . Alcohol use No  . Drug use: No  . Sexual activity: Not Asked   Other Topics Concern  . None   Social History Narrative  . None    Socially she is divorced for 29 years.  She has 2 children.  She lives at home with her son and daughter.  She had completed a masters degree and a law degree.  She graduated from [employer]. law school.  She is retired.  There is no history of tobacco use.  She recently has not exercised, but in the past had done recumbent bike and treadmill.  Family History:  The patient's family history includes Asthma in her other; COPD in her other; Depression in her other; Heart disease in her other; Hypertension in her brother, mother, and other; Stroke in her other.  Her mother died at age 75 years.  Her father still living at 60.  A brother died at age 57 with diabetes, pancreatitis.  Her sister is 16 and has hypertension and scoliosis.  ROS General: Negative; No fevers, chills, or night sweats; positive for 100 weight gain over 10 years HEENT: Negative; No changes in vision or hearing, sinus congestion, difficulty swallowing Pulmonary: Negative; No cough, wheezing, shortness of breath, hemoptysis Cardiovascular: See history of present illness GI: Negative; No nausea, vomiting, diarrhea, or abdominal pain GU: Negative; No dysuria, hematuria, or difficulty voiding Musculoskeletal: Negative; no myalgias, joint pain, or weakness Hematologic/Oncology: Negative; no easy bruising, bleeding Endocrine:  Positive for diabetes mellitus Neuro: Positive for migraine headaches Skin: Positive for psoriasis Psychiatric: Negative; No behavioral problems, depression Sleep: Negative; No snoring, daytime sleepiness, hypersomnolence, bruxism, restless legs, hypnogognic hallucinations, no cataplexy Other comprehensive 14 point system review is negative.   PHYSICAL EXAM:   VS:  BP (!) 144/96   Pulse 72   Ht 5' 7.5" (1.715 m)   Wt 277 lb 6.4 oz (125.8 kg)   LMP 05/27/2013   BMI 42.81 kg/m    Morbidly obese  Repeat blood pressure by me 122/82  Wt Readings from Last 3 Encounters:  08/01/17 277 lb 6.4 oz (125.8 kg)  05/02/17 275 lb 9.6 oz (125 kg)  07/28/16 280 lb (127 kg)    General: Alert, oriented, no distress.  Skin: normal turgor, no  rashes, warm and dry HEENT: Normocephalic, atraumatic. Pupils equal round and reactive to light; sclera anicteric; extraocular muscles intact; Fundi Mild increase vessel tortuosity.  No hemorrhages or exudates.  Disc flat. Nose without nasal septal hypertrophy Mouth/Parynx benign; Mallinpatti scale 3 Neck: No JVD, no carotid bruits; normal carotid upstroke Lungs: clear to ausculatation and percussion; no wheezing or rales Chest wall: without tenderness to palpitation Heart: PMI not displaced, RRR, s1 s2 normal, 1/6 systolic murmur, no diastolic murmur, no rubs, gallops, thrills, or heaves Abdomen: soft, nontender; no hepatosplenomehaly, BS+; abdominal aorta nontender and not dilated by palpation. Back: no CVA tenderness Pulses 2+ Musculoskeletal: full range of motion, normal strength, no joint deformities Extremities: no clubbing cyanosis or edema, Homan's sign negative  Neurologic: grossly nonfocal; Cranial nerves grossly wnl Psychologic: Normal mood and affect   Studies/Labs Reviewed:   EKG:  EKG is ordered today. ECG (independently read by me): Normal sinus rhythm at 72 bpm.  Nonspecific ST-T changes.  Normal intervals.  May 2018 ECG (independently  read by me): Normal sinus rhythm at 73 bpm.  Non-specific ST changes inferiorly.  Normal intervals  Recent Labs:  I personally reviewed the laboratory done at [facility] from 04/17/2017 as noted above. Laboratory from July 2018 was reviewed  BMP Latest Ref Rng & Units 07/05/2017 08/20/2013 05/28/2013  Glucose 65 - 99 mg/dL 119(H) 330(H) 253(H)  BUN 7 - 25 mg/dL _0 Creatinine 0.50 - 0.99 mg/dL 0.75 0.85 0.70  Sodium 135 - 146 mmol/L 140 135 133(L)  Potassium 3.5 - 5.3 mmol/L 4.4 3.5 3.9  Chloride 98 - 110 mmol/L 103 95(L) 96  CO2 20 - 31 mmol/L 27 32 28  Calcium 8.6 - 10.4 mg/dL 9.2 9.4 9.3     Hepatic Function Latest Ref Rng & Units 07/05/2017 09/05/2013 05/27/2013  Total Protein 6.1 - 8.1 g/dL 6.4 7.1 7.8  Albumin 3.6 - 5.1 g/dL 3.9 4.0 4.0  AST 10 - 35 U/L 19 27 50(H)  ALT 6 - 29 U/L _1 Alk Phosphatase 33 - 130 U/L 79 86 113  Total Bilirubin 0.2 - 1.2 mg/dL 0.4 0.7 0.7  Bilirubin, Direct 0.0 - 0.3 mg/dL - 0.2 -    CBC Latest Ref Rng & Units 05/28/2013 05/27/2013 10/17/2006  WBC 4.0 - 10.5 K/uL 8.7 8.0 5.6  Hemoglobin 12.0 - 15.0 g/dL 12.5 13.0 12.6  Hematocrit 36.0 - 46.0 % 37.1 37.9 37.1  Platelets 150 - 400 K/uL 215 213 232   Lab Results  Component Value Date   MCV 84.3 05/28/2013   MCV 83.7 05/27/2013   MCV 92.3 10/17/2006   Lab Results  Component Value Date   TSH 3.15 10/17/2006   Lab Results  Component Value Date   HGBA1C 7.0 (H) 12/04/2013     BNP No results found for: BNP  ProBNP    Component Value Date/Time   PROBNP 457.5 (H) 05/27/2013 1945     Lipid Panel     Component Value Date/Time   CHOL 103 07/05/2017 0935   TRIG 95 07/05/2017 0935   TRIG 79 10/17/2006 0820   HDL 35 (L) 07/05/2017 0935   CHOLHDL 2.9 07/05/2017 0935   VLDL 19 07/05/2017 0935   LDLCALC 49 07/05/2017 0935     RADIOLOGY: No results found.   Additional studies/ records that were reviewed today include:  I reviewed the office records from [facility] as well as laboratory.    ASSESSMENT:    No diagnosis found.  PLAN:  Ms. Prophete is a 61 year old female who has a history of hypertension since 2009, type 2 diabetes mellitus since 2014,  hyperlipidemia, and morbid obesity who presented to me several months ago with increasing symptoms of shortness of breath with walking and occasional lightheadedness with exertion.  Remotely, she was found to have normal coronary arteries at cardiac catheterization approximately in 2000.  Remotely, she had been on amlodipine and add some time had also been on atenolol.  She complained that when she was treated with losartan this contributed to hair loss.  Her blood pressure was elevated and I recommended titration of valsartan at her last office visit from 40-80 mg.  She was just notified that her manufacturer of valsartan has been implicated as well in possible valsartan impurity.  For this reason, I will switch her to irbesartan, initially at 150 mg.  If she notes her blood pressure is consistently getting too low.  She potentially can decrease this to 75 mg.  Her blood pressure today is improved and on recheck by me was 122/82.  I again discussed the importance of weight loss since BMI is 42.81.   I discussed the importance of exercising at least 5 days per week up to 30 minutes at a time if at all possible.  I reviewed her most recent laboratory.  She is tolerating the change to Crestor with significant benefit.  She continues to take HCTZ and there is no edema today.  She is diabetic on metformin.  I will see her in 6 months for reevaluation.    Medication Adjustments/Labs and Tests Ordered: Current medicines are reviewed at length with the patient today.  Concerns regarding medicines are outlined above.  Medication changes, Labs and Tests ordered today are listed in the Patient Instructions below. There are no Patient Instructions on file for this visit.   Signed, Shelva Majestic, MD, [facility] 08/01/2017  3:23 PM    [facility] [address] [place], [place], [place]  [address] Phone: [phone]

## 2018-03-12 ENCOUNTER — Ambulatory Visit: Payer: Self-pay | Admitting: Cardiovascular Disease

## 2018-04-19 ENCOUNTER — Ambulatory Visit (INDEPENDENT_AMBULATORY_CARE_PROVIDER_SITE_OTHER): Payer: Self-pay | Admitting: Cardiovascular Disease

## 2018-04-19 ENCOUNTER — Encounter: Payer: Self-pay | Admitting: Cardiovascular Disease

## 2018-04-19 VITALS — BP 124/82 | HR 67 | Ht 67.5 in | Wt 278.4 lb

## 2018-04-19 DIAGNOSIS — E119 Type 2 diabetes mellitus without complications: Secondary | ICD-10-CM

## 2018-04-19 DIAGNOSIS — I10 Essential (primary) hypertension: Secondary | ICD-10-CM

## 2018-04-19 DIAGNOSIS — E785 Hyperlipidemia, unspecified: Secondary | ICD-10-CM

## 2018-04-19 NOTE — Patient Instructions (Signed)
Medication Instructions:  Your physician recommends that you continue on your current medications as directed. Please refer to the Current Medication list given to you today.  Follow-Up: As needed with Dr. Kelly    If you need a refill on your cardiac medications before your next appointment, please call your pharmacy.   

## 2018-04-19 NOTE — Progress Notes (Signed)
Cardiology Office Note    Date:  04/19/2018   ID:  NARAYA STONEBERG, DOB 06/21/1956, MRN [id number]  PCP:  London Pepper, MD  Cardiologist:  Shelva Majestic, MD   Chief Complaint  Patient presents with  . Follow-up    History of Present Illness:  ASLEE SUCH is a 62 y.o. female who was referred for cardiology consultation through the courtesy of Dr. America Brown for evaluation of fatigue, shortness of breath, and vague chest pain. I saw her initially in May 2018 last saw her in August 2018.  She presents for a 95-monthfollow-up evaluation.  Ms. BOttehas a history of type 2 diabetes mellitus, hypertension, migraines, hyperlipidemia, and recently has developed shortness of breath with walking and lightheadedness with exertion.  She tells me that in 2000.  She had undergone a cardiac catheterization and was told of having normal coronary arteries.  She has been on pravastatin for hyperlipidemia.  With her history of diabetes mellitus and hypertension.  She is now referred for cardiology consultation.  She typically notices the shortness of breath when she walks up steps.  She denies chest tightness. Laboratory on 04/17/2017 showed a glucose of 130.  She had normal renal function.  Cholesterol was 170 with an LDL cholesterol at 107.  Hemoglobin A1c was 6.4.  TSH was normal.  She has been on pravastatin 10 mg for hyperlipidemia and valsartan 40 mg and HCTZ 25 mg for hypertension.  She is on metformin 1000 mg twice a day for her diabetes mellitus.  She has a history of obesity, and states that over the past 10 years she has gained approximately 100 pounds.    When I initially saw her, she had been on valsartan 40 mg for blood pressure and her blood pressure was elevated.  I titrated valsartan to 80 mg daily.  With her diabetes mellitus, her target LDL is less than 70 and I recommended she discontinue pravastatin and changed her to rosuvastatin for more effective lipid-lowering.  I scheduled her for  an echo Doppler study which was done on 05/17/2017.  This revealed normal systolic and diastolic function.  Her left atrium was moderately dilated.  Subsequent blood work has shown significant improvement in her lipid studies with atenolol.  Total cholesterol improved to 103, and LDL cholesterol at 49.  HDL was 35.  She was  notified that her manufacturer of valsartan was stated with the valsartan impurity.  As result when I last saw her I changed her to irbesartan an increased dose of 150 mg.  Since I last saw her, she has felt well with this medication change.  Her blood pressure has remained stable.  He denies any shortness of breath.  She denies any palpitations.  There is no chest pain.  She will be seeing her primary physician at the end of May and laboratory will be checked.  She presents for reevaluation.  Past Medical History:  Diagnosis Date  . Diabetes mellitus without complication ([facility]   . Hypertension   . Psoriasis     Past Surgical History:  Procedure Laterality Date  . ABDOMINAL HYSTERECTOMY    . APPENDECTOMY    . EYE SURGERY    . TONSILLECTOMY    . TONSILLECTOMY    . TUBAL LIGATION      Current Medications: Outpatient Medications Prior to Visit  Medication Sig Dispense Refill  . aspirin 81 MG tablet Take 81 mg by mouth daily.    .Marland Kitchenglucose blood (TRUETEST  TEST) test strip And lancets 1/day 100 each 3  . hydrochlorothiazide (HYDRODIURIL) 25 MG tablet Take 25 mg by mouth daily.  1  . irbesartan (AVAPRO) 150 MG tablet Take 1 tablet (150 mg total) by mouth daily. 90 tablet 3  . KLOR-CON M20 20 MEQ tablet Take 20 mEq by mouth daily.  1  . metFORMIN (GLUCOPHAGE) 1000 MG tablet Take 1,000 mg by mouth 2 (two) times daily with a meal.  1  . triamcinolone cream (KENALOG) 0.1 % Apply 1 application topically 2 (two) times daily. For 1-2 weeks  0  . TRUEPLUS LANCETS 33G MISC 1 each by Does not apply route as directed. 100 each 3  . rosuvastatin (CRESTOR) 10 MG tablet Take 1 tablet  (10 mg total) by mouth daily. 90 tablet 3   No facility-administered medications prior to visit.      Allergies:   Ibuprofen; Mushroom extract complex; Penicillins; and Toluene [toluol]   Social History   Socioeconomic History  . Marital status: Divorced    Spouse name: Not on file  . Number of children: Not on file  . Years of education: Not on file  . Highest education level: Not on file  Occupational History  . Not on file  Social Needs  . Financial resource strain: Not on file  . Food insecurity:    Worry: Not on file    Inability: Not on file  . Transportation needs:    Medical: Not on file    Non-medical: Not on file  Tobacco Use  . Smoking status: Never Smoker  . Smokeless tobacco: Never Used  Substance and Sexual Activity  . Alcohol use: No  . Drug use: No  . Sexual activity: Not on file  Lifestyle  . Physical activity:    Days per week: Not on file    Minutes per session: Not on file  . Stress: Not on file  Relationships  . Social connections:    Talks on phone: Not on file    Gets together: Not on file    Attends religious service: Not on file    Active member of club or organization: Not on file    Attends meetings of clubs or organizations: Not on file    Relationship status: Not on file  Other Topics Concern  . Not on file  Social History Narrative  . Not on file    Socially she is divorced for 29 years.  She has 2 children.  She lives at home with her son and daughter.  She had completed a masters degree and a law degree.  She graduated from [employer]. law school.  She is retired.  There is no history of tobacco use.  She recently has not exercised, but in the past had done recumbent bike and treadmill.  Family History:  The patient's family history includes Asthma in her other; COPD in her other; Depression in her other; Heart disease in her other; Hypertension in her brother, mother, and other; Stroke in her other.  Her mother died at age 28  years.  Her father still living at 58.  A brother died at age 31 with diabetes, pancreatitis.  Her sister is 69 and has hypertension and scoliosis.  ROS General: Negative; No fevers, chills, or night sweats; positive for 100 weight gain over 10 years HEENT: Negative; No changes in vision or hearing, sinus congestion, difficulty swallowing Pulmonary: Negative; No cough, wheezing, shortness of breath, hemoptysis Cardiovascular: See history of present illness  GI: Negative; No nausea, vomiting, diarrhea, or abdominal pain GU: Negative; No dysuria, hematuria, or difficulty voiding Musculoskeletal: Negative; no myalgias, joint pain, or weakness Hematologic/Oncology: Negative; no easy bruising, bleeding Endocrine: Positive for diabetes mellitus Neuro: Positive for migraine headaches Skin: Positive for psoriasis Psychiatric: Negative; No behavioral problems, depression Sleep: Negative; No snoring, daytime sleepiness, hypersomnolence, bruxism, restless legs, hypnogognic hallucinations, no cataplexy Other comprehensive 14 point system review is negative.   PHYSICAL EXAM:   VS:  BP 124/82   Pulse 67   Ht 5' 7.5" (1.715 m)   Wt 278 lb 6.4 oz (126.3 kg)   LMP 05/27/2013   BMI 42.96 kg/m    Morbidly obese Repeat blood pressure by me was 122/80  Wt Readings from Last 3 Encounters:  04/19/18 278 lb 6.4 oz (126.3 kg)  08/01/17 277 lb 6.4 oz (125.8 kg)  05/02/17 275 lb 9.6 oz (125 kg)     General: Alert, oriented, no distress.  Skin: normal turgor, no rashes, warm and dry HEENT: Normocephalic, atraumatic. Pupils equal round and reactive to light; sclera anicteric; extraocular muscles intact;  Nose without nasal septal hypertrophy Mouth/Parynx benign; Mallinpatti scale 3 Neck: No JVD, no carotid bruits; normal carotid upstroke Lungs: clear to ausculatation and percussion; no wheezing or rales Chest wall: without tenderness to palpitation Heart: PMI not displaced, RRR, s1 s2 normal, 1/6  systolic murmur, no diastolic murmur, no rubs, gallops, thrills, or heaves Abdomen: soft, nontender; no hepatosplenomehaly, BS+; abdominal aorta nontender and not dilated by palpation. Back: no CVA tenderness Pulses 2+ Musculoskeletal: full range of motion, normal strength, no joint deformities Extremities: no clubbing cyanosis or edema, Homan's sign negative  Neurologic: grossly nonfocal; Cranial nerves grossly wnl Psychologic: Normal mood and affect   Studies/Labs Reviewed:   EKG:  EKG is ordered today. ECG (independently read by me): Normal sinus rhythm at 67 bpm.  Isolated PAC.  Nonspecific ST changes.  August 2018 ECG (independently read by me): Normal sinus rhythm at 72 bpm.  Nonspecific ST-T changes.  Normal intervals.  May 2018 ECG (independently read by me): Normal sinus rhythm at 73 bpm.  Non-specific ST changes inferiorly.  Normal intervals  Recent Labs:  I personally reviewed the laboratory done at [facility] from 04/17/2017 as noted above. Laboratory from July 2018 was reviewed  BMP Latest Ref Rng & Units 07/05/2017 08/20/2013 05/28/2013  Glucose 65 - 99 mg/dL 119(H) 330(H) 253(H)  BUN 7 - 25 mg/dL 20 18 15   Creatinine 0.50 - 0.99 mg/dL 0.75 0.85 0.70  Sodium 135 - 146 mmol/L 140 135 133(L)  Potassium 3.5 - 5.3 mmol/L 4.4 3.5 3.9  Chloride 98 - 110 mmol/L 103 95(L) 96  CO2 20 - 31 mmol/L 27 32 28  Calcium 8.6 - 10.4 mg/dL 9.2 9.4 9.3     Hepatic Function Latest Ref Rng & Units 07/05/2017 09/05/2013 05/27/2013  Total Protein 6.1 - 8.1 g/dL 6.4 7.1 7.8  Albumin 3.6 - 5.1 g/dL 3.9 4.0 4.0  AST 10 - 35 U/L 19 27 50(H)  ALT 6 - 29 U/L 17 26 25   Alk Phosphatase 33 - 130 U/L 79 86 113  Total Bilirubin 0.2 - 1.2 mg/dL 0.4 0.7 0.7  Bilirubin, Direct 0.0 - 0.3 mg/dL - 0.2 -    CBC Latest Ref Rng & Units 05/28/2013 05/27/2013 10/17/2006  WBC 4.0 - 10.5 K/uL 8.7 8.0 5.6  Hemoglobin 12.0 - 15.0 g/dL 12.5 13.0 12.6  Hematocrit 36.0 - 46.0 % 37.1 37.9 37.1  Platelets 150 -  400 K/uL 215 213 232   Lab Results  Component Value Date   MCV 84.3 05/28/2013   MCV 83.7 05/27/2013   MCV 92.3 10/17/2006   Lab Results  Component Value Date   TSH 3.15 10/17/2006   Lab Results  Component Value Date   HGBA1C 7.0 (H) 12/04/2013     BNP No results found for: BNP  ProBNP    Component Value Date/Time   PROBNP 457.5 (H) 05/27/2013 1945     Lipid Panel     Component Value Date/Time   CHOL 103 07/05/2017 0935   TRIG 95 07/05/2017 0935   TRIG 79 10/17/2006 0820   HDL 35 (L) 07/05/2017 0935   CHOLHDL 2.9 07/05/2017 0935   VLDL 19 07/05/2017 0935   LDLCALC 49 07/05/2017 0935     RADIOLOGY: No results found.   Additional studies/ records that were reviewed today include:  I reviewed the office records from [place] as well as laboratory.  ------------------------------------------------------------------- May 17, 2017  ECHO study Conclusions  - Left ventricle: The cavity size was normal. Systolic function was   normal. The estimated ejection fraction was in the range of 60%   to 65%. Wall motion was normal; there were no regional wall   motion abnormalities. Left ventricular diastolic function   parameters were normal. - Left atrium: The atrium was moderately dilated. - Atrial septum: No defect or patent foramen ovale was identified.   ASSESSMENT:    1. Essential hypertension   2. Type 2 diabetes mellitus without complication, without long-term current use of insulin ([facility])   3. Hyperlipidemia LDL goal <70   4. Morbid obesity [facility])      PLAN:  Ms. Perona is a 62 year old female who has a history of hypertension since 2009, type 2 diabetes mellitus since 2014,  hyperlipidemia, and morbid obesity who initially presented to me with increasing symptoms of shortness of breath with walking and occasional lightheadedness with exertion.  Remotely, she was found to have normal coronary arteries at cardiac catheterization approximately in  2000.  Remotely, she had been on amlodipine and add some time had also been on atenolol.  She complained that when she was treated with losartan this contributed to hair loss.  Her blood pressure today is stable on her current dose of irbesartan 150 mg.  She has been taking HCTZ 25 mg.  She denies any edema with this regimen.  She also has been on rosuvastatin 10 mg for hyperlipidemia.  She is tolerating this well.  Her last lipid studies in July 2018 showed an excellent LDL cholesterol at 49.  Hemoglobin A1c in November 2018 was 6.5.  Her BMI is 42.96 and is consistent with morbid obesity.  Weight loss was recommended.  We again discussed the importance of increased exercise.  She will be following up with her primary MD May.  Repeat laboratory will be done at that time.  Clinically she is stable from a cardiovascular standpoint.  As result, I will see her on an as-needed basis a time in the future if problems develop but she will follow-up with her primary MD.   Medication Adjustments/Labs and Tests Ordered: Current medicines are reviewed at length with the patient today.  Concerns regarding medicines are outlined above.  Medication changes, Labs and Tests ordered today are listed in the Patient Instructions below. Patient Instructions  Medication Instructions:  Your physician recommends that you continue on your current medications as directed. Please refer to the Current Medication list given  to you today.  Follow-Up: As needed with Dr. Claiborne Billings    If you need a refill on your cardiac medications before your next appointment, please call your pharmacy.      Signed, Shelva Majestic, MD, [facility] 04/19/2018 9:29 AM    [facility] [address] [place], [place], [place]  [address] Phone: [phone]

## 2020-03-05 ENCOUNTER — Ambulatory Visit: Payer: Self-pay | Attending: Internal Medicine

## 2020-03-05 DIAGNOSIS — Z23 Encounter for immunization: Secondary | ICD-10-CM

## 2020-03-05 NOTE — Progress Notes (Signed)
   Covid-19 Vaccination Clinic  Name:  CHONTEL WARNING    MRN: [id number] DOB: 1956-09-05  03/05/2020  Ms. Hayes was observed post Covid-19 immunization for 30 minutes based on pre-vaccination screening without incident. She was provided with Vaccine Information Sheet and instruction to access the V-Safe system.   Ms. Danielsen was instructed to call 911 with any severe reactions post vaccine: Marland Kitchen Difficulty breathing  . Swelling of face and throat  . A fast heartbeat  . A bad rash all over body  . Dizziness and weakness   Immunizations Administered    Name Date Dose VIS Date Route   Pfizer COVID-19 Vaccine 03/05/2020  1:12 PM 0.3 mL 11/29/2019 Intramuscular   Manufacturer: [employer], [employer]   Lot: [id number]   NDC: [address]-9

## 2020-03-30 ENCOUNTER — Ambulatory Visit: Payer: Medicare Other | Attending: Internal Medicine

## 2020-03-30 DIAGNOSIS — Z23 Encounter for immunization: Secondary | ICD-10-CM

## 2020-03-30 NOTE — Progress Notes (Signed)
   Covid-19 Vaccination Clinic  Name:  Cynthia Kemp    MRN: [id number] DOB: September 14, 1956  03/30/2020  [name] was observed post Covid-19 immunization for 15 minutes without incident. She was provided with Vaccine Information Sheet and instruction to access the V-Safe system.   [name] was instructed to call 911 with any severe reactions post vaccine: Marland Kitchen Difficulty breathing  . Swelling of face and throat  . A fast heartbeat  . A bad rash all over body  . Dizziness and weakness   Immunizations Administered    Name Date Dose VIS Date Route   Pfizer COVID-19 Vaccine 03/30/2020  1:54 PM 0.3 mL 11/29/2019 Intramuscular   Manufacturer: [employer], [employer]   Lot: [id number]   NDC: [address]-0

## 2020-04-29 ENCOUNTER — Other Ambulatory Visit: Payer: Self-pay

## 2020-04-29 ENCOUNTER — Ambulatory Visit (INDEPENDENT_AMBULATORY_CARE_PROVIDER_SITE_OTHER): Payer: Medicare Other | Admitting: Family Medicine

## 2020-04-29 ENCOUNTER — Ambulatory Visit: Payer: Self-pay

## 2020-04-29 ENCOUNTER — Encounter: Payer: Self-pay | Admitting: Family Medicine

## 2020-04-29 ENCOUNTER — Ambulatory Visit (INDEPENDENT_AMBULATORY_CARE_PROVIDER_SITE_OTHER): Payer: Medicare Other

## 2020-04-29 VITALS — BP 118/82 | HR 68 | Ht 67.5 in | Wt 265.0 lb

## 2020-04-29 DIAGNOSIS — M545 Low back pain, unspecified: Secondary | ICD-10-CM

## 2020-04-29 DIAGNOSIS — M6528 Calcific tendinitis, other site: Secondary | ICD-10-CM

## 2020-04-29 DIAGNOSIS — M48061 Spinal stenosis, lumbar region without neurogenic claudication: Secondary | ICD-10-CM | POA: Diagnosis not present

## 2020-04-29 DIAGNOSIS — M25572 Pain in left ankle and joints of left foot: Secondary | ICD-10-CM

## 2020-04-29 MED ORDER — VITAMIN D (ERGOCALCIFEROL) 1.25 MG (50000 UNIT) PO CAPS: 50000.0000 [IU] | ORAL_CAPSULE | ORAL | 0 refills | Status: DC

## 2020-04-29 MED ORDER — GABAPENTIN 100 MG PO CAPS: 200.0000 mg | ORAL_CAPSULE | Freq: Every day | ORAL | 0 refills | Status: DC

## 2020-04-29 NOTE — Progress Notes (Addendum)
[facility] [address] [place] [address] Phone: [phone] Subjective:   Cynthia Kemp, am serving as a scribe for Dr. Antoine Kemp. This visit occurred during the SARS-CoV-2 public health emergency.  Safety protocols were in place, including screening questions prior to the visit, additional usage of staff PPE, and extensive cleaning of exam room while observing appropriate contact time as indicated for disinfecting solutions.    I'm seeing this patient by the request  of:  Cynthia Has, MD  CC: Ankle pain and low back pain  [id number]  Cynthia Kemp is a 64 y.o. female coming in with complaint of left foot achilles tendon pain. Last seen in 2015 for frozen shoulder. Patient states that one week ago she woke up with pain in achilles in middle of the night. Patient had to limp for 7 days. Did ice the foot.   Also having lower back pain. Only able to walk a quarter of a mile due to pain. Standing and walking increases her pain. Sitting improves her pain. Patient would like to lose weight but cannot be active due to pain.  States that unfortunately seems to be worsening over the course of time.  No nighttime awakening secondary to the back pain.     Patient was measured and fitted for an off-the-shelf brace. Adjustments were made to brace to ensure proper fit.     Past Medical History:  Diagnosis Date  . Diabetes mellitus without complication ([facility])   . Hypertension   . Psoriasis    Past Surgical History:  Procedure Laterality Date  . ABDOMINAL HYSTERECTOMY    . APPENDECTOMY    . EYE SURGERY    . TONSILLECTOMY    . TONSILLECTOMY    . TUBAL LIGATION     Social History   Socioeconomic History  . Marital status: Divorced    Spouse name: Not on file  . Number of children: Not on file  . Years of education: Not on file  . Highest education level: Not on file  Occupational History  . Not on file  Tobacco Use  . Smoking  status: Never Smoker  . Smokeless tobacco: Never Used  Substance and Sexual Activity  . Alcohol use: No  . Drug use: No  . Sexual activity: Not on file  Other Topics Concern  . Not on file  Social History Narrative  . Not on file   Social Determinants of Health   Financial Resource Strain:   . Difficulty of Paying Living Expenses:   Food Insecurity:   . Worried About Programme researcher, broadcasting/film/video in the Last Year:   . Barista in the Last Year:   Transportation Needs:   . Freight forwarder (Medical):   Marland Kitchen Lack of Transportation (Non-Medical):   Physical Activity:   . Days of Exercise per Week:   . Minutes of Exercise per Session:   Stress:   . Feeling of Stress :   Social Connections:   . Frequency of Communication with Friends and Family:   . Frequency of Social Gatherings with Friends and Family:   . Attends Religious Services:   . Active Member of Clubs or Organizations:   . Attends Banker Meetings:   Marland Kitchen Marital Status:    Allergies  Allergen Reactions  . Ibuprofen     REACTION: face swells  . Mushroom Extract Complex   . Penicillins     unknown  . Toluene [Toluol]  Facial swelling   Family History  Problem Relation Age of Onset  . Hypertension Mother   . Hypertension Brother   . Asthma Other   . COPD Other   . Hypertension Other   . Depression Other   . Stroke Other   . Heart disease Other     Current Outpatient Medications (Endocrine & Metabolic):  .  metFORMIN (GLUCOPHAGE) 1000 MG tablet, Take 1,000 mg by mouth 2 (two) times daily with a meal.  Current Outpatient Medications (Cardiovascular):  .  hydrochlorothiazide (HYDRODIURIL) 25 MG tablet, Take 25 mg by mouth daily. .  irbesartan (AVAPRO) 150 MG tablet, Take 1 tablet (150 mg total) by mouth daily. .  rosuvastatin (CRESTOR) 10 MG tablet, Take 1 tablet (10 mg total) by mouth daily.   Current Outpatient Medications (Analgesics):  .  aspirin 81 MG tablet, Take 81 mg by mouth  daily.   Current Outpatient Medications (Other):  .  glucose blood (TRUETEST TEST) test strip, And lancets 1/day .  KLOR-CON M20 20 MEQ tablet, Take 20 mEq by mouth daily. Marland Kitchen  triamcinolone cream (KENALOG) 0.1 %, Apply 1 application topically 2 (two) times daily. For 1-2 weeks .  TRUEPLUS LANCETS 33G MISC, 1 each by Does not apply route as directed. .  gabapentin (NEURONTIN) 100 MG capsule, Take 2 capsules (200 mg total) by mouth at bedtime. .  Vitamin D, Ergocalciferol, (DRISDOL) 1.25 MG (50000 UNIT) CAPS capsule, Take 1 capsule (50,000 Units total) by mouth every 7 (seven) days.   Reviewed prior external information including notes and imaging from  primary care provider As well as notes that were available from care everywhere and other healthcare systems.  Past medical history, social, surgical and family history all reviewed in electronic medical record.  No pertanent information unless stated regarding to the chief complaint.   Review of Systems:  No headache, visual changes, nausea, vomiting, diarrhea, constipation, dizziness, abdominal pain, skin rash, fevers, chills, night sweats, weight loss, swollen lymph nodes, body aches, joint swelling, chest pain, shortness of breath, mood changes. POSITIVE muscle aches  Objective  Blood pressure 118/82, pulse 68, height 5' 7.5" (1.715 m), weight 265 lb (120.2 kg), last menstrual period 05/27/2013, SpO2 98 %.   General: No apparent distress alert and oriented x3 mood and affect normal, dressed appropriately.  HEENT: Pupils equal, extraocular movements intact  Respiratory: Patient's speak in full sentences and does not appear short of breath  Cardiovascular: No lower extremity edema, non tender, no erythema  Neuro: Cranial nerves II through XII are intact, neurovascularly intact in all extremities with 2+ DTRs and 2+ pulses.  Gait antalgic.  MSK: Ankle: Range of motion of the ankle noted mildly decreased.  Patient does have high growing  nodule noted.  Minor tender to palpation.  Negative Thompson.  Neurovascularly intact distally.  Achilles reflex intact  Low back exam.  Does have some mild loss of lordosis.  Poor core strength.  Difficult to assess secondary to patient's body habitus.  Patient does have tightness with Pearlean Brownie but is able to do so.  Negative straight leg test.  Neurovascularly intact distally.  MSK US performed of left ankle This study was ordered, performed, and interpreted by Terrilee Files D.O.  Foot/Ankle:   Limited ultrasound of patient's ankle shows that patient does have severe calcific Achilles tendinosis with bone spurring noted off the posterior aspect of the calcaneal region.  Patient does have some mild intrasubstance tearing with increasing in neovascularization with acute hypoechoic changes.  IMPRESSION:  Calcific Achilles tendinitis with mild intersubstance tear noted.     Impression and Recommendations:    Patient was measured and fitted for an off-the-shelf brace. Adjustments were made to brace to ensure proper fit.     This case required medical decision making of moderate complexity. The above documentation Kemp been reviewed and is accurate and complete Lyndal Pulley, DO       Note: This dictation was prepared with Dragon dictation along with smaller phrase technology. Any transcriptional errors that result from this process are unintentional.

## 2020-04-29 NOTE — Patient Instructions (Addendum)
Exercises 3x a week Brace throughout the day Avoid being barefoot Pennsaid 2x daily finger tip sized amount Once weekly Vit D Gabapentin 200mg  at night Back xray today See me in 4-5 weeks

## 2020-04-29 NOTE — Assessment & Plan Note (Signed)
Calcific changes noted.  Pneumatic brace given, avoid being barefoot, topical anti-inflammatories.  Unable to do nitroglycerin secondary to history of migraines.  Home exercises given and work with Event organiser.  Icing regimen.  Follow-up again in 4 to 8 weeks

## 2020-04-29 NOTE — Assessment & Plan Note (Signed)
Severe.  Started gabapentin, discussed home exercises.  Discussed the importance of weight loss and patient will consider going to healthy weight and wellness that she has already been referred to.  Follow-up again in 6 to 8 weeks.  Worsening symptoms formal physical therapy will likely be needed

## 2020-05-03 ENCOUNTER — Encounter: Payer: Self-pay | Admitting: Family Medicine

## 2020-05-12 ENCOUNTER — Ambulatory Visit (INDEPENDENT_AMBULATORY_CARE_PROVIDER_SITE_OTHER): Payer: Self-pay | Admitting: Family Medicine

## 2020-05-26 ENCOUNTER — Ambulatory Visit (INDEPENDENT_AMBULATORY_CARE_PROVIDER_SITE_OTHER): Payer: Self-pay | Admitting: Family Medicine

## 2020-06-03 ENCOUNTER — Ambulatory Visit: Payer: Medicare Other | Admitting: Family Medicine

## 2020-09-20 ENCOUNTER — Other Ambulatory Visit: Payer: Self-pay | Admitting: Family Medicine

## 2021-03-17 ENCOUNTER — Encounter: Payer: Self-pay | Admitting: Dermatology

## 2021-03-17 ENCOUNTER — Other Ambulatory Visit: Payer: Self-pay

## 2021-03-17 ENCOUNTER — Ambulatory Visit (INDEPENDENT_AMBULATORY_CARE_PROVIDER_SITE_OTHER): Payer: Medicare Other | Admitting: Dermatology

## 2021-03-17 DIAGNOSIS — L920 Granuloma annulare: Secondary | ICD-10-CM | POA: Diagnosis not present

## 2021-03-17 DIAGNOSIS — L409 Psoriasis, unspecified: Secondary | ICD-10-CM

## 2021-03-17 MED ORDER — CLOBETASOL PROPIONATE 0.05 % EX FOAM: Freq: Every day | CUTANEOUS | 1 refills | Status: AC

## 2021-03-17 NOTE — Patient Instructions (Addendum)
Psoriasis  What causes psoriasis? A patient's immune system plays a role in the development of psoriasis through the over-activity of a type of white blood cell called a T cell.  Once these cells are activated, a reaction is triggered that causes the skin to grow faster than normal.  New skin cells form in days instead of weeks, and these cells do not shed.  These cells pile up on the skin, and this results in what you see as psoriasis.  It is not contagious.  There is a genetic component to psoriasis, although not everyone inherits the gene.  What triggers psoriasis? Common triggers are stress, strep throat infection (more commonly in kids), and cold weather.  During stressful events, psoriasis tends to flare.  Certain medications such as lithium, some blood pressure medications, and some drugs used to treat malaria can trigger psoriasis.  A skin injury can also trigger psoriasis to develop at the site of injury.  Types of Psoriasis 1. Plaque psoriasis:  80% of patients with psoriasis have plaque psoriasis.  Plaques usually form on elbows, knees, and lower back and appear raised and reddish with a silvery white scale. 2. Nail psoriasis:  Fingernails and toenails may be affected.  Initially small pits may form, then with time, the nails thicken, lift up, and crumble.   3. Scalp psoriasis:  Similar in appearance to plaque psoriasis on the body.  Tends to be itchy.  Clinically can resemble dandruff because of the scales that can fall on a patient's shirt.  This may be difficult to control. 4. Pustular psoriasis:  Usually involves the palms and soles appearing as white, pus-filled bumps.  Rarely, it may develop all over the body, which can make patients seriously ill. 5. Guttate psoriasis:  Usually occurs in children and young adults.  The lesions are smaller than plaque psoriasis.  May be triggered by a strep throat infection.  Many times it clears on its own, and the patient may or may not develop  psoriasis again.   6. Inverse psoriasis:  Involves the folds of the skin and may be painful.  This appears as red, shiny patches.  It may involve the armpits, under the breasts, the genital area, or the crease of the buttock.  7. Psoriatic arthritis:  Up to one third of patients with psoriasis will develop arthritis.  It commonly affects the hands, feet, and spine, and may begin as stiffness.  If untreated it may lead to permanent joint damage.  Treatment There is no cure for psoriasis, but it can be controlled.  Some patients undergo more than one type of treatment.  1. Topical medications (applied externally to the skin) a. Corticosteroids:  typically first-line treatment for psoriasis.  They control the inflammation of psoriasis.  They are available as creams, ointments, sprays, foams, or lotions.  It is important to follow your dermatologist's instructions as to how to apply the medicine.  For example, excessive use of strong steroid creams (especially to body creases and the face) can cause thinning and lightening of the skin.  This can lead to the formation of stretch marks in the folds.   b. Calcipotriene and calcipotriol (Vitamin D derivatives):  These are usually used in conjunction with a steroid cream.  Sometimes they may be used as maintenance once psoriasis is under control. c. Retinoids:  sometimes used in conjunction with a topical steroid cream.  Women should not use retinoids if they are pregnant. d. Coal tar:  an older treatment that   is still effective, especially in combination with steroids.  It can be messy and may have an odor in some preparations. 2. Light treatments:  may provide a safe and effective treatment for psoriasis.  The main risks of light therapy are sunburn and possible increase in skin cancers.   a. Laser therapy:  can treat a certain stubborn area of psoriasis such as scalp, feet, and hands.  It is not used for large areas. b. Narrowband UVB:  A patient stands in  front of panels of lights for a set amount of time.  A typical course might be 24 treatments over 2 months.  c. PUVA:  This treatment combines exposure to UVA light with light-sensitizing medication called psoralen.  It may come as a pill or as a lotion.  The main side effects are nausea (from the psoralen pill) and significantly increased risk of skin cancer. 3. Oral medications:  used for moderate to severe psoriasis.  These medications are very effective, but they have a number of potentially serious side effects. a. Methotrexate b. Soriatane (acitretin) c. Cyclosporine 4. Biologic medications:  used for moderate to severe psoriasis.  These are newer medications that suppress the immune cells responsible for psoriasis.  They generally produce good results, but they all increase the risk of infection.  These are given as injections or IV infusions. a. Enbrel (etanercept) b. Humira (adalimumab) c. Remicade (infliximab) d. Stelara (ustekinumab)  Recent studies have found that patients with psoriasis are more prone to developing metabolic syndrome:  high blood pressure, coronary artery disease, high cholesterol, and diabetes.  It is very important for patients with psoriasis to live healthy lifestyles.  Diet and exercise are important.  Support groups:   www.psoriasis.org , http://www.[facility].com/psoriasisnet/index.html    

## 2021-04-02 ENCOUNTER — Encounter: Payer: Self-pay | Admitting: Dermatology

## 2021-04-02 NOTE — Progress Notes (Signed)
   New Patient   Subjective  Cynthia Kemp is a 65 y.o. female who presents for the following: New Patient (Initial Visit) (Patient her today for Psoriasis x 7 years, per patient she has the Psoriasis behind her right ear, scalp, lower back, possible knees and elbows. Per patient her scalp, behind right ear and lower back itch. Per patient she has tried OTC Gold Bond Cream and PCP gave her a prescription of Triamcinolone not helping.).  Chronic psoriasis, newer bumps on arms Location:  Duration:  Quality:  Associated Signs/Symptoms: Modifying Factors:  Severity:  Timing: Context:    The following portions of the chart were reviewed this encounter and updated as appropriate:  Tobacco  Allergies  Meds  Problems  Med Hx  Surg Hx  Fam Hx      Objective  Well appearing patient in no apparent distress; mood and affect are within normal limits. Objective  Right Posterior Auricle, Scalp: Small plaque psoriasis perhaps 5% body surface area, most severe on the head.  Objective  Left Elbow - Posterior, Right Elbow - Posterior: Dermal papules and Arkell form/ovoid clusters    A focused examination was performed including Head, neck, back, upper chest, arms.. Relevant physical exam findings are noted in the Assessment and Plan.   Assessment & Plan  Psoriasis (2) Right Posterior Auricle; Scalp  Essentially all treatment options reviewed including topicals, light-based therapy, and systemic therapies.  Discussed the relationship of psoriasis with adult metabolic disorder with emphasis on benefits of healthy lifestyle choices.  Topical clobetasol daily for 30 days; avoid use on face and body folds.  Recheck at that time.  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  She may try the same topical  clobetasol.

## 2021-05-26 ENCOUNTER — Ambulatory Visit: Payer: Medicare Other | Admitting: Dermatology

## 2021-09-28 NOTE — Progress Notes (Signed)
[facility]yn[facility]emp [address] [place] Phone: [phone] Subjective:   [name], am serving as a scribe for Dr. Hulan Saas. This visit occurred during the SARS-CoV-2 public health emergency.  Safety protocols were in place, including screening questions prior to the visit, additional usage of staff PPE, and extensive cleaning of exam room while observing appropriate contact time as indicated for disinfecting solutions.   I'm seeing this patient by the request  of:  Cynthia Pepper, MD  CC: Neck pain  [id number]  Last seen May 2021 for lumbar stenosis  Cynthia Kemp is a 65 y.o. female coming in with complaint of neck pain. Left side of neck pulsating and sharp pains that run into her shoulder. No specific movement causes the pain. This started a couple of months ago after standing for a while taking images for a mammogram. Ice seems to be the only thing that helps momentarily. Has does some neck exercises, but doesn't seem to help      Past Medical History:  Diagnosis Date   Diabetes mellitus without complication ([place])    Hypertension    Psoriasis    Past Surgical History:  Procedure Laterality Date   ABDOMINAL HYSTERECTOMY     APPENDECTOMY     EYE SURGERY     TONSILLECTOMY     TONSILLECTOMY     TUBAL LIGATION     Social History   Socioeconomic History   Marital status: Divorced    Spouse name: Not on file   Number of children: Not on file   Years of education: Not on file   Highest education level: Not on file  Occupational History   Not on file  Tobacco Use   Smoking status: Never   Smokeless tobacco: Never  Substance and Sexual Activity   Alcohol use: No   Drug use: No   Sexual activity: Not on file  Other Topics Concern   Not on file  Social History Narrative   Not on file   Social Determinants of Health   Financial Resource Strain: Not on file  Food Insecurity: Not on file  Transportation  Needs: Not on file  Physical Activity: Not on file  Stress: Not on file  Social Connections: Not on file   Allergies  Allergen Reactions   Ibuprofen     REACTION: face swells   Mushroom Extract Complex    Penicillins     unknown   Toluene [Toluol]     Facial swelling   Family History  Problem Relation Age of Onset   Hypertension Mother    Hypertension Brother    Asthma Other    COPD Other    Hypertension Other    Depression Other    Stroke Other    Heart disease Other     Current Outpatient Medications (Endocrine & Metabolic):    metFORMIN (GLUCOPHAGE) 1000 MG tablet, Take 1,000 mg by mouth 2 (two) times daily with a meal.  Current Outpatient Medications (Cardiovascular):    hydrochlorothiazide (HYDRODIURIL) 25 MG tablet, Take 25 mg by mouth daily.   irbesartan (AVAPRO) 300 MG tablet, Take 300 mg by mouth daily.   rosuvastatin (CRESTOR) 10 MG tablet, Take 1 tablet (10 mg total) by mouth daily.   Current Outpatient Medications (Analgesics):    aspirin 81 MG tablet, Take 81 mg by mouth daily.   Current Outpatient Medications (Other):    gabapentin (NEURONTIN) 100 MG capsule, Take 2 capsules (200 mg total)  by mouth at bedtime.   Blood Glucose Monitoring Suppl (ONETOUCH VERIO FLEX SYSTEM) w/Device KIT, USE AS DIRECTED ONCE A DAY   clobetasol (OLUX) 0.05 % topical foam, Apply topically daily. Not On Face or Folds   glucose blood (TRUETEST TEST) test strip, And lancets 1/day   KLOR-CON M20 20 MEQ tablet, Take 20 mEq by mouth daily.   triamcinolone cream (KENALOG) 0.1 %, Apply 1 application topically 2 (two) times daily. For 1-2 weeks   TRUEPLUS LANCETS 33G MISC, 1 each by Does not apply route as directed.    Review of Systems:  No headache, visual changes, nausea, vomiting, diarrhea, constipation, dizziness, abdominal pain, skin rash, fevers, chills, night sweats, weight loss, swollen lymph nodes, joint swelling, chest pain, shortness of breath, mood changes. POSITIVE  muscle aches, body aches  Objective  Blood pressure (!) 160/86, pulse 78, height _0  (1.702 m), weight 260 lb (117.9 kg), last menstrual period 05/27/2013, SpO2 99 %.   General: No apparent distress alert and oriented x3 mood and affect normal, dressed appropriately.  Respiratory: Patient's speak in full sentences and does not appear short of breath  Cardiovascular: No lower extremity edema, non tender, no erythema  Neck exam does have some mild loss of lordosis.  Difficulty with left-sided sidebending.  Significant tightness noted on the left side of the neck.  Negative Spurling's 5-5 strength Upper back exam also has significant tightness in the left trapezius area. Continued loss of lordosis of the lumbar spine with limited extension.  Osteopathic findings  C6 flexed rotated and side bent left T4 extended rotated and side bent left with inhaled rib L4 flexed rotated and side bent right Sacrum right on right Impression and Recommendations:     The above documentation has been reviewed and is accurate and complete Lyndal Pulley, DO

## 2021-09-29 ENCOUNTER — Ambulatory Visit (INDEPENDENT_AMBULATORY_CARE_PROVIDER_SITE_OTHER): Payer: Medicare Other | Admitting: Family Medicine

## 2021-09-29 ENCOUNTER — Ambulatory Visit (INDEPENDENT_AMBULATORY_CARE_PROVIDER_SITE_OTHER): Payer: Medicare Other

## 2021-09-29 ENCOUNTER — Other Ambulatory Visit: Payer: Self-pay

## 2021-09-29 VITALS — BP 160/86 | HR 78 | Ht 67.0 in | Wt 260.0 lb

## 2021-09-29 DIAGNOSIS — M9903 Segmental and somatic dysfunction of lumbar region: Secondary | ICD-10-CM

## 2021-09-29 DIAGNOSIS — M9901 Segmental and somatic dysfunction of cervical region: Secondary | ICD-10-CM

## 2021-09-29 DIAGNOSIS — M9908 Segmental and somatic dysfunction of rib cage: Secondary | ICD-10-CM | POA: Diagnosis not present

## 2021-09-29 DIAGNOSIS — M5010 Cervical disc disorder with radiculopathy, unspecified cervical region: Secondary | ICD-10-CM | POA: Insufficient documentation

## 2021-09-29 DIAGNOSIS — M9902 Segmental and somatic dysfunction of thoracic region: Secondary | ICD-10-CM

## 2021-09-29 DIAGNOSIS — M9904 Segmental and somatic dysfunction of sacral region: Secondary | ICD-10-CM

## 2021-09-29 DIAGNOSIS — M542 Cervicalgia: Secondary | ICD-10-CM | POA: Diagnosis not present

## 2021-09-29 DIAGNOSIS — M48061 Spinal stenosis, lumbar region without neurogenic claudication: Secondary | ICD-10-CM | POA: Diagnosis not present

## 2021-09-29 MED ORDER — GABAPENTIN 100 MG PO CAPS: 200.0000 mg | ORAL_CAPSULE | Freq: Every day | ORAL | 0 refills | Status: DC

## 2021-09-29 NOTE — Assessment & Plan Note (Addendum)
Do believe the patient's pain is likely secondary to more of a cervical disc radiculopathy.  We discussed icing regimen and home exercises.  Discussed which activities to do and which ones to avoid.  Increase activity slowly.  Discussed gabapentin.  Responded well to osteopathic manipulation.  Follow-up again in 4 to 6 weeks worsening pain may need to consider advanced imaging or physical therapy.  No weakness noted today on exam patient did respond relatively well to muscle energy and FPR techniques of the neck and the upper back.  Patient given exercises to work on the strengthening of the scapula.  We will get x-rays to further evaluate for any type of arthritis that could be contributing.  Patient does have some spinal stenosis of the lumbar spine and we will continue to monitor that could be also similar to presentation which patient is having in the neck.  Follow-up with me again in 4 to 6 weeks

## 2021-09-29 NOTE — Patient Instructions (Addendum)
Xray today Do prescribed exercises at least 3x a week Did well with manipulation today See you again in 4-6 week

## 2021-10-01 ENCOUNTER — Encounter: Payer: Self-pay | Admitting: Family Medicine

## 2021-10-01 DIAGNOSIS — M9901 Segmental and somatic dysfunction of cervical region: Secondary | ICD-10-CM | POA: Insufficient documentation

## 2021-10-01 NOTE — Assessment & Plan Note (Signed)
Has been stable over the course of time with mild exacerbation now.  Patient will start on gabapentin as we discussed for the neck as well and hopefully this will help some of the back.  Responded well to manipulation.  Follow-up again in 5 weeks

## 2021-10-01 NOTE — Assessment & Plan Note (Addendum)
   Decision today to treat with OMT was based on Physical Exam  After verbal consent patient was treated with  ME, FPR techniques in cervical, thoracic, rib, lumbar and sacral areas, all areas are chronic   Patient tolerated the procedure well with improvement in symptoms  Patient given exercises, stretches and lifestyle modifications  See medications in patient instructions if given  Patient will follow up in 4-6 weeks

## 2021-11-03 ENCOUNTER — Ambulatory Visit: Payer: Medicare Other | Admitting: Family Medicine

## 2021-12-02 NOTE — Progress Notes (Signed)
[facility] [address] [place] [address] Phone: [phone] Subjective:   Cynthia Kemp, am serving as a scribe for Dr. Antoine Primas. This visit occurred during the SARS-CoV-2 public health emergency.  Safety protocols were in place, including screening questions prior to the visit, additional usage of staff PPE, and extensive cleaning of exam room while observing appropriate contact time as indicated for disinfecting solutions.   I'm seeing this patient by the request  of:  Farris Has, MD  CC: Neck pain follow-up  [id number]  Cynthia Kemp is a 65 y.o. female coming in with complaint of back and neck pain. OMT on 09/29/2021. Patient states that her pain is subsiding in L cervical spine. Pressure and dull ache still there with cervical flexion. Discontinue gabapentin.   Medications patient has been prescribed: Gabapentin  Taking:         Reviewed prior external information including notes and imaging from previsou exam, outside providers and external EMR if available.   As well as notes that were available from care everywhere and other healthcare systems.  Past medical history, social, surgical and family history all reviewed in electronic medical record.  No pertanent information unless stated regarding to the chief complaint.   Past Medical History:  Diagnosis Date   Diabetes mellitus without complication ([facility])    Hypertension    Psoriasis     Allergies  Allergen Reactions   Ibuprofen     REACTION: face swells   Mushroom Extract Complex    Penicillins     unknown   Toluene [Toluol]     Facial swelling     Review of Systems:  No headache, visual changes, nausea, vomiting, diarrhea, constipation, dizziness, abdominal pain, skin rash, fevers, chills, night sweats, weight loss, swollen lymph nodes, body aches, joint swelling, chest pain, shortness of breath, mood changes. POSITIVE muscle aches  Objective  Blood  pressure 108/66, pulse 79, height 5\' 7"  (1.702 m), weight 258 lb (117 kg), last menstrual period 05/27/2013, SpO2 99 %.   General: No apparent distress alert and oriented x3 mood and affect normal, dressed appropriately.  HEENT: Pupils equal, extraocular movements intact  Respiratory: Patient's speak in full sentences and does not appear short of breath  Cardiovascular: No lower extremity edema, non tender, no erythema  Mild antalgic gait Neck exam does still have some mild loss of lordosis.  Tightness noted on the left C2-C3 area.  He does have some very limited range of motion with rotation. Low back exam does have some loss of lordosis.  Patient has some poor core strength noted.  Tightness with straight leg test.  Osteopathic findings  C3 flexed rotated and side bent left T3 extended rotated and side bent left inhaled rib T9 extended rotated and side bent left L2 flexed rotated and side bent right Sacrum right on right       Assessment and Plan:  Cervical disc disorder with radiculopathy of cervical region Known arthritic changes but continues to make improvement.  Discussed icing regimen and home exercises.  Increase activity slowly.  Patient has made improvement over the course of time and is no longer taking the gabapentin.  Patient will continue with her exercises and see me again in 2 to 3 months  Degenerative lumbar spinal stenosis Degenerative disc disease.  Patient is doing relatively well.  We will continue to monitor.  Still responding well to osteopathic manipulation.  Follow-up again in 6 to 8 weeks  Nonallopathic problems  Decision today to treat with OMT was based on Physical Exam  After verbal consent patient was treated with HVLA, ME, FPR techniques in cervical, rib, thoracic, lumbar, and sacral  areas  Patient tolerated the procedure well with improvement in symptoms  Patient given exercises, stretches and lifestyle modifications  See medications in  patient instructions if given  Patient will follow up in 8 weeks     The above documentation has been reviewed and is accurate and complete Cynthia Saa, DO        Note: This dictation was prepared with Dragon dictation along with smaller phrase technology. Any transcriptional errors that result from this process are unintentional.

## 2021-12-03 ENCOUNTER — Ambulatory Visit (INDEPENDENT_AMBULATORY_CARE_PROVIDER_SITE_OTHER): Payer: Medicare Other | Admitting: Family Medicine

## 2021-12-03 ENCOUNTER — Other Ambulatory Visit: Payer: Self-pay

## 2021-12-03 ENCOUNTER — Encounter: Payer: Self-pay | Admitting: Family Medicine

## 2021-12-03 VITALS — BP 108/66 | HR 79 | Ht 67.0 in | Wt 258.0 lb

## 2021-12-03 DIAGNOSIS — M9903 Segmental and somatic dysfunction of lumbar region: Secondary | ICD-10-CM | POA: Diagnosis not present

## 2021-12-03 DIAGNOSIS — M9901 Segmental and somatic dysfunction of cervical region: Secondary | ICD-10-CM | POA: Diagnosis not present

## 2021-12-03 DIAGNOSIS — M5010 Cervical disc disorder with radiculopathy, unspecified cervical region: Secondary | ICD-10-CM

## 2021-12-03 DIAGNOSIS — M9904 Segmental and somatic dysfunction of sacral region: Secondary | ICD-10-CM | POA: Diagnosis not present

## 2021-12-03 DIAGNOSIS — M9902 Segmental and somatic dysfunction of thoracic region: Secondary | ICD-10-CM

## 2021-12-03 DIAGNOSIS — M9908 Segmental and somatic dysfunction of rib cage: Secondary | ICD-10-CM

## 2021-12-03 DIAGNOSIS — M48061 Spinal stenosis, lumbar region without neurogenic claudication: Secondary | ICD-10-CM | POA: Diagnosis not present

## 2021-12-03 NOTE — Assessment & Plan Note (Signed)
Known arthritic changes but continues to make improvement.  Discussed icing regimen and home exercises.  Increase activity slowly.  Patient has made improvement over the course of time and is no longer taking the gabapentin.  Patient will continue with her exercises and see me again in 2 to 3 months

## 2021-12-03 NOTE — Assessment & Plan Note (Addendum)
Degenerative disc disease.  Patient is doing relatively well.  We will continue to monitor.  Still responding well to osteopathic manipulation.  Follow-up again in  8 weeks

## 2021-12-03 NOTE — Patient Instructions (Signed)
Making progress See me in 8 weeks

## 2022-01-27 NOTE — Progress Notes (Signed)
[name] [address] [place] [address] Phone: [phone] Subjective:   Bruce Donath, am serving as a scribe for Dr. Antoine Primas.  This visit occurred during the SARS-CoV-2 public health emergency.  Safety protocols were in place, including screening questions prior to the visit, additional usage of staff PPE, and extensive cleaning of exam room while observing appropriate contact time as indicated for disinfecting solutions.   I'm seeing this patient by the request  of:  Farris Has, MD  CC: back and neck pain   [id number]  Cynthia Kemp is a 66 y.o. female coming in with complaint of back and neck pain. OMT on 12/03/2021. Patient states that she has been doing well. No issues since last visit.  Medications patient has been prescribed: none  Taking:         Reviewed prior external information including notes and imaging from previsou exam, outside providers and external EMR if available.   As well as notes that were available from care everywhere and other healthcare systems.  Past medical history, social, surgical and family history all reviewed in electronic medical record.  No pertanent information unless stated regarding to the chief complaint.   Past Medical History:  Diagnosis Date   Diabetes mellitus without complication ([facility])    Hypertension    Psoriasis     Allergies  Allergen Reactions   Ibuprofen     REACTION: face swells   Mushroom Extract Complex    Penicillins     unknown   Toluene [Toluol]     Facial swelling     Review of Systems:  No headache, visual changes, nausea, vomiting, diarrhea, constipation, dizziness, abdominal pain, skin rash, fevers, chills, night sweats, weight loss, swollen lymph nodes, body aches, joint swelling, chest pain, shortness of breath, mood changes. POSITIVE muscle aches  Objective  Blood pressure 120/72, pulse 72, height 5\' 7"  (1.702 m), weight 251 lb (113.9 kg),  last menstrual period 05/27/2013, SpO2 99 %.   General: No apparent distress alert and oriented x3 mood and affect normal, dressed appropriately.  HEENT: Pupils equal, extraocular movements intact  Respiratory: Patient's speak in full sentences and does not appear short of breath  Cardiovascular: No lower extremity edema, non tender, no erythema  Neck exam still has significant tightness noted mostly at the occipital region.  Patient does have some mobility sidebending.  Crepitus noted. Low back exam does have loss of lordosis.  Difficulty with more than 5 degrees of extension.  Patient does have tenderness noted.  Unable to do FABER test secondary to tightness.  Osteopathic findings  C2 flexed rotated and side bent right C7 flexed rotated and side bent left T4 extended rotated and side bent right inhaled rib T6 extended rotated and side bent left L2 flexed rotated and side bent right Sacrum right on right       Assessment and Plan:  Degenerative lumbar spinal stenosis Patient does have some degenerative spinal stenosis.  Discussed icing regimen and home exercises.  Discussed need loss of lordosis.  Patient does respond well to manipulation.  Follow-up with me again in 6 to 8 weeks    Nonallopathic problems  Decision today to treat with OMT was based on Physical Exam  After verbal consent patient was treated with HVLA, ME, FPR techniques in cervical, rib, thoracic, lumbar, and sacral  areas  Patient tolerated the procedure well with improvement in symptoms  Patient given exercises, stretches and lifestyle modifications  See  medications in patient instructions if given  Patient will follow up in 4-8 weeks      The above documentation has been reviewed and is accurate and complete Judi Saa, DO        Note: This dictation was prepared with Dragon dictation along with smaller phrase technology. Any transcriptional errors that result from this process are  unintentional.

## 2022-01-28 ENCOUNTER — Encounter: Payer: Self-pay | Admitting: Family Medicine

## 2022-01-28 ENCOUNTER — Ambulatory Visit (INDEPENDENT_AMBULATORY_CARE_PROVIDER_SITE_OTHER): Payer: Medicare Other | Admitting: Family Medicine

## 2022-01-28 ENCOUNTER — Other Ambulatory Visit: Payer: Self-pay

## 2022-01-28 VITALS — BP 120/72 | HR 72 | Ht 67.0 in | Wt 251.0 lb

## 2022-01-28 DIAGNOSIS — M9901 Segmental and somatic dysfunction of cervical region: Secondary | ICD-10-CM

## 2022-01-28 DIAGNOSIS — M9904 Segmental and somatic dysfunction of sacral region: Secondary | ICD-10-CM

## 2022-01-28 DIAGNOSIS — M9903 Segmental and somatic dysfunction of lumbar region: Secondary | ICD-10-CM | POA: Diagnosis not present

## 2022-01-28 DIAGNOSIS — M9908 Segmental and somatic dysfunction of rib cage: Secondary | ICD-10-CM | POA: Diagnosis not present

## 2022-01-28 DIAGNOSIS — M48061 Spinal stenosis, lumbar region without neurogenic claudication: Secondary | ICD-10-CM | POA: Diagnosis not present

## 2022-01-28 DIAGNOSIS — M9902 Segmental and somatic dysfunction of thoracic region: Secondary | ICD-10-CM | POA: Diagnosis not present

## 2022-01-28 NOTE — Patient Instructions (Signed)
Great to see you Overall doing well See me again in 2-3 months

## 2022-01-28 NOTE — Assessment & Plan Note (Signed)
Patient does have some degenerative spinal stenosis.  Discussed icing regimen and home exercises.  Discussed need loss of lordosis.  Patient does respond well to manipulation.  Follow-up with me again in 6 to 8 weeks

## 2022-03-16 ENCOUNTER — Ambulatory Visit: Payer: Medicare Other | Admitting: Dermatology

## 2022-04-08 ENCOUNTER — Ambulatory Visit: Payer: Medicare Other | Admitting: Family Medicine

## 2022-07-27 ENCOUNTER — Encounter (INDEPENDENT_AMBULATORY_CARE_PROVIDER_SITE_OTHER): Payer: Self-pay

## 2022-12-16 IMAGING — DX DG CERVICAL SPINE 2 OR 3 VIEWS
3 series · 3 of 3 positions shown · non-contrast
Comparison: None.

CLINICAL DATA: Left neck pain radiating to shoulder

EXAM:
CERVICAL SPINE - 2-3 VIEW

[c-spine lat]
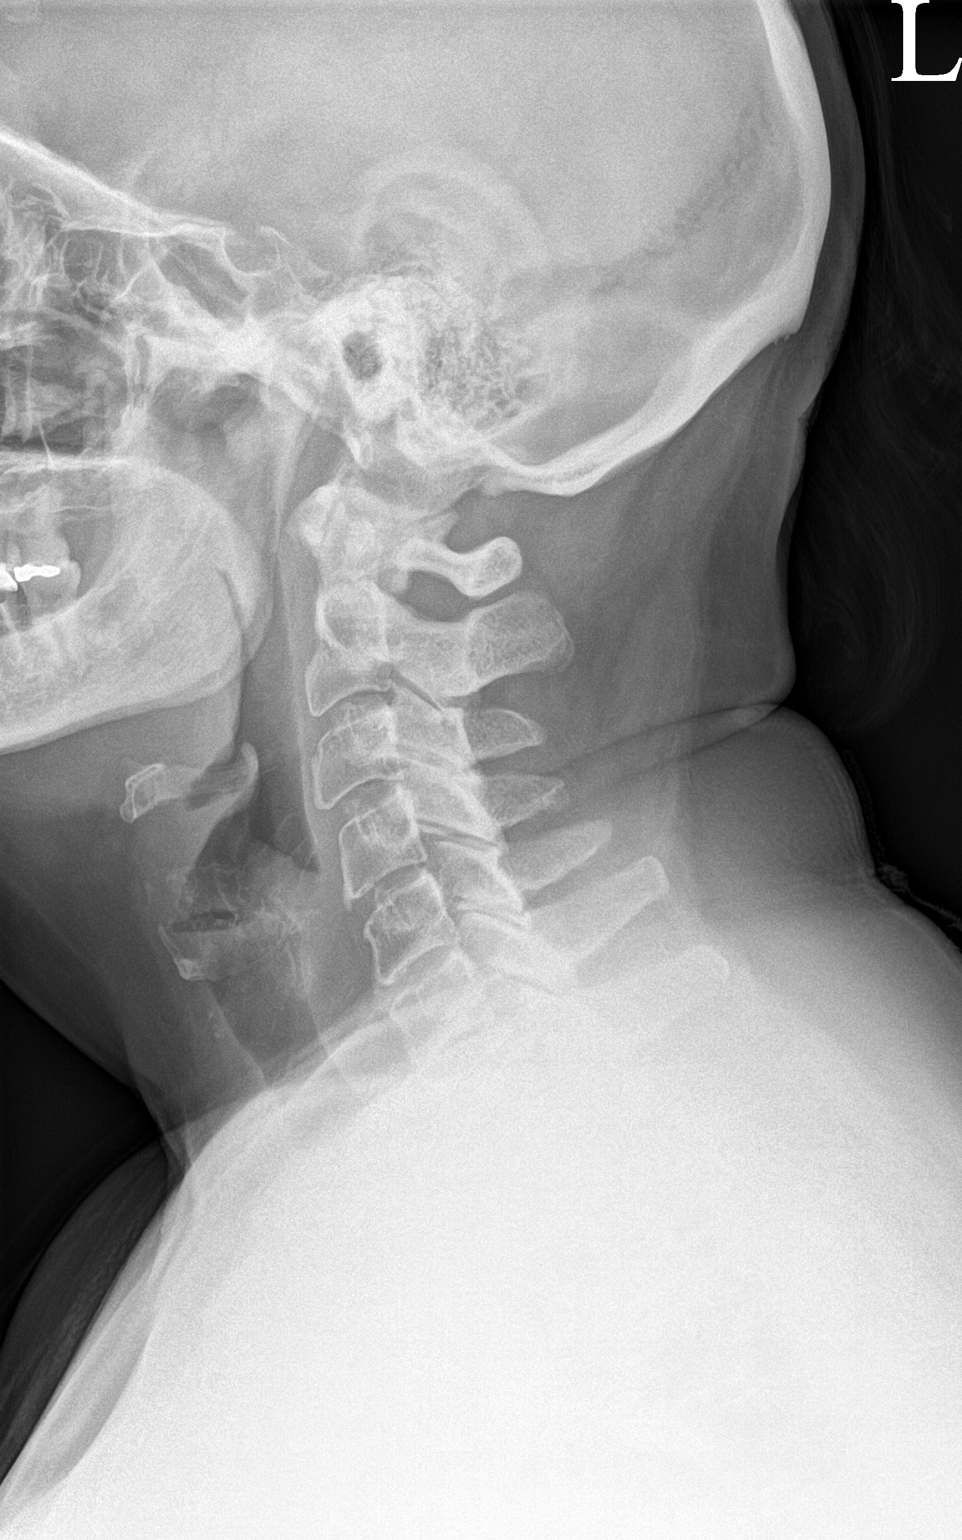

[c-spine ap]
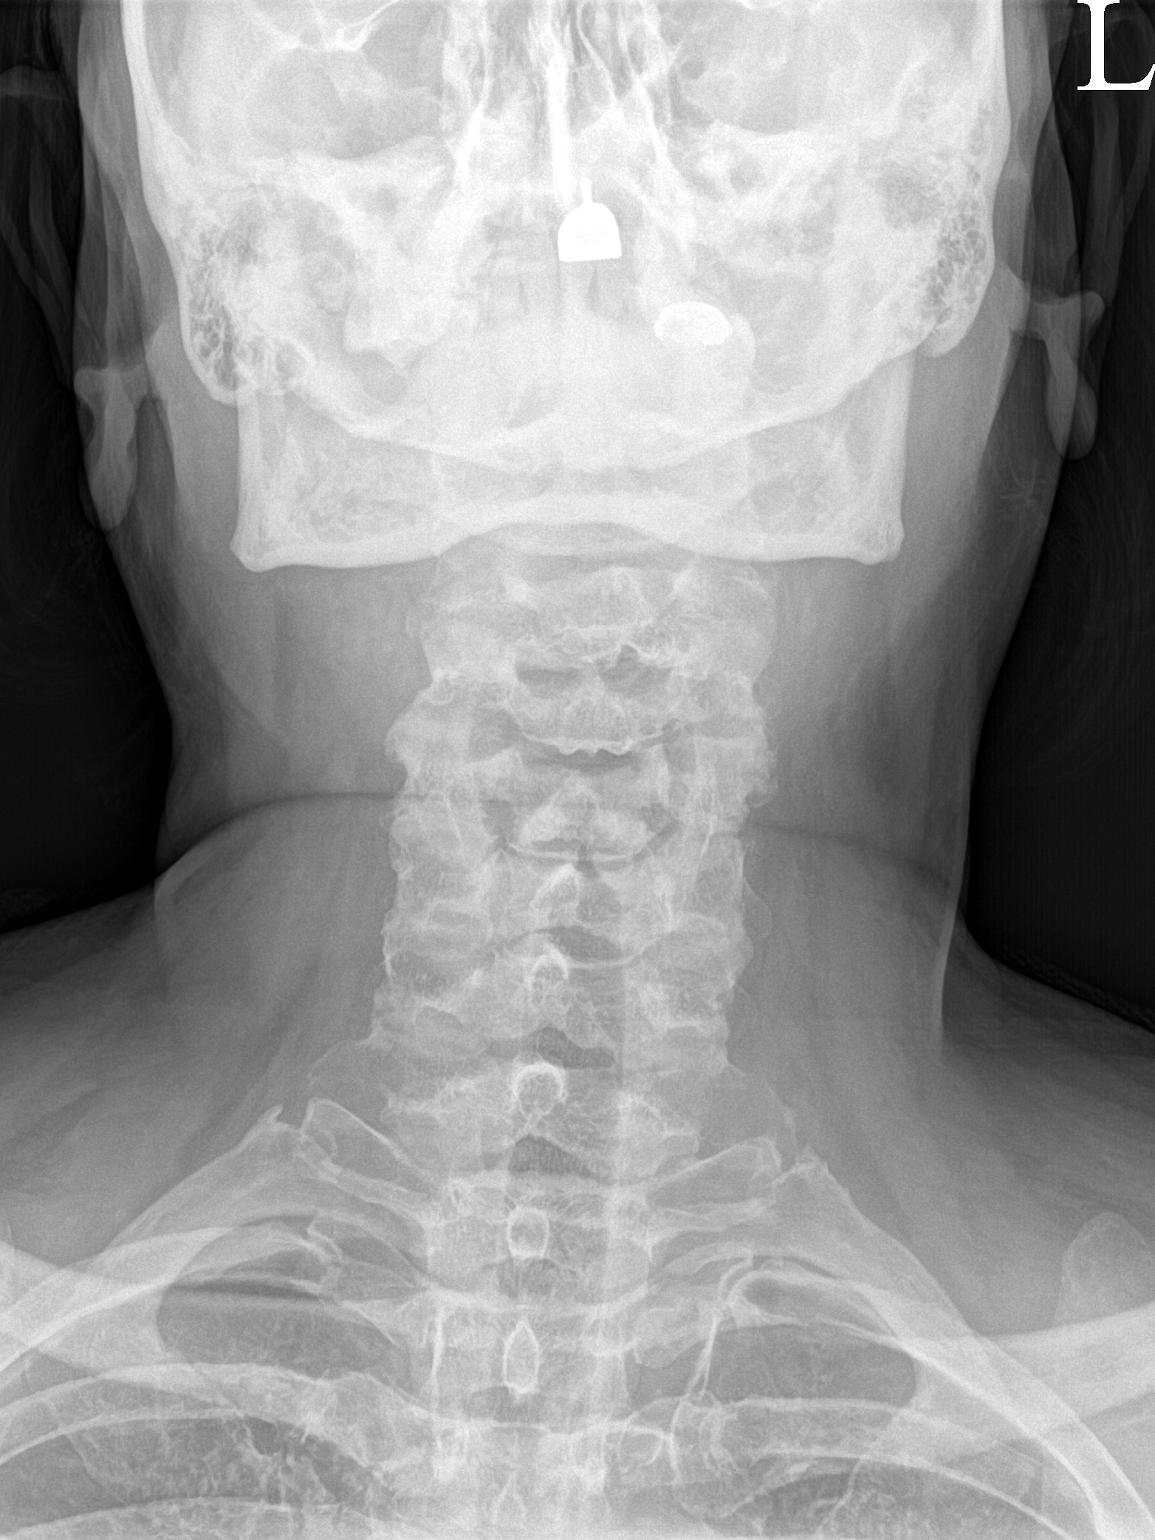

[c-spine open mouth]
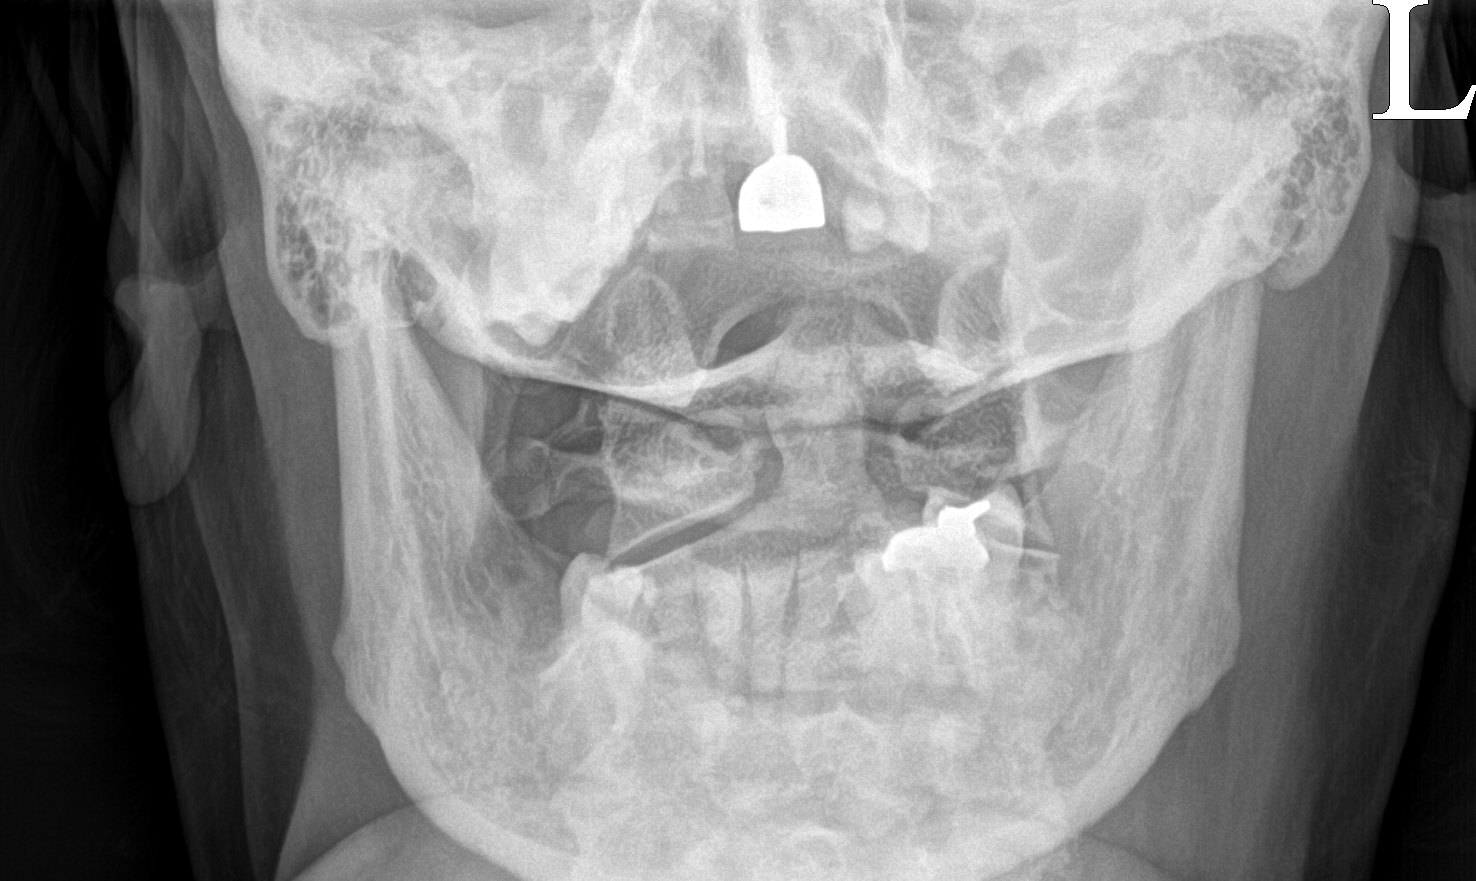

[3 of 3 positions shown; findings below may reference images not displayed]

FINDINGS: The cervical spine is imaged through the C6 vertebral body in the
lateral projection. Vertebral body heights are preserved. Alignment
is normal. There is mild degenerative endplate change without
significant disc space narrowing in the imaged cervical spine.

The prevertebral soft tissues are unremarkable.
IMPRESSION: Minimal degenerative changes in the cervical spine. No acute
findings.

## 2022-12-29 ENCOUNTER — Other Ambulatory Visit: Payer: Self-pay | Admitting: Family Medicine

## 2022-12-29 DIAGNOSIS — R921 Mammographic calcification found on diagnostic imaging of breast: Secondary | ICD-10-CM

## 2023-06-16 ENCOUNTER — Ambulatory Visit
Admission: RE | Admit: 2023-06-16 | Discharge: 2023-06-16 | Disposition: A | Discharge status: Home / Self Care | Payer: Medicare Other | Source: Ambulatory Visit | Attending: Family Medicine | Admitting: Family Medicine

## 2023-06-16 DIAGNOSIS — R921 Mammographic calcification found on diagnostic imaging of breast: Secondary | ICD-10-CM

## 2023-11-08 NOTE — Progress Notes (Unsigned)
[name] [address] [place] [address] Phone: [phone] Subjective:   INadine Counts, am serving as a scribe for Dr. Antoine Primas.  I'm seeing this patient by the request  of:  Farris Has, MD  CC: Left knee pain  [id number]  01/28/2022 OMT  Updated 11/09/2023 Cynthia Kemp is a 67 y.o. female coming in with complaint of knee pain. L knee around the patella. About 2 weeks ago got on the bed with her knee and hasn't been the same since. Has been wearing a hinge brace. Sharp with palpations, but can be ache. Has tried some home therapy. Hurts with knee extension and pain has been waking her at night.      Past Medical History:  Diagnosis Date   Diabetes mellitus without complication (HCC)    Hypertension    Psoriasis    Past Surgical History:  Procedure Laterality Date   ABDOMINAL HYSTERECTOMY     APPENDECTOMY     EYE SURGERY     TONSILLECTOMY     TONSILLECTOMY     TUBAL LIGATION     Social History   Socioeconomic History   Marital status: Divorced    Spouse name: Not on file   Number of children: Not on file   Years of education: Not on file   Highest education level: Not on file  Occupational History   Not on file  Tobacco Use   Smoking status: Never   Smokeless tobacco: Never  Substance and Sexual Activity   Alcohol use: No   Drug use: No   Sexual activity: Not on file  Other Topics Concern   Not on file  Social History Narrative   Not on file   Social Determinants of Health   Financial Resource Strain: Not on file  Food Insecurity: Not on file  Transportation Needs: Not on file  Physical Activity: Not on file  Stress: Not on file  Social Connections: Not on file   Allergies  Allergen Reactions   Ibuprofen     REACTION: face swells   Mushroom Extract Complex    Penicillins     unknown   Toluene [Toluol]     Facial swelling   Family History  Problem Relation Age of Onset   Breast  cancer Mother    Hypertension Mother    Breast cancer Maternal Grandmother    Hypertension Brother    Asthma Other    COPD Other    Hypertension Other    Depression Other    Stroke Other    Heart disease Other     Current Outpatient Medications (Endocrine & Metabolic):    metFORMIN (GLUCOPHAGE) 1000 MG tablet, Take 1,000 mg by mouth 2 (two) times daily with a meal.  Current Outpatient Medications (Cardiovascular):    hydrochlorothiazide (HYDRODIURIL) 25 MG tablet, Take 25 mg by mouth daily.   rosuvastatin (CRESTOR) 10 MG tablet, Take 1 tablet (10 mg total) by mouth daily.   valsartan (DIOVAN) 160 MG tablet, Take 160 mg by mouth daily.   Current Outpatient Medications (Analgesics):    aspirin 81 MG tablet, Take 81 mg by mouth daily.   Current Outpatient Medications (Other):    Blood Glucose Monitoring Suppl (ONETOUCH VERIO FLEX SYSTEM) w/Device KIT, USE AS DIRECTED ONCE A DAY   clobetasol (OLUX) 0.05 % topical foam, Apply topically daily. Not On Face or Folds   glucose blood (TRUETEST TEST) test strip, And lancets 1/day   KLOR-CON M20 20  MEQ tablet, Take 20 mEq by mouth daily.   triamcinolone cream (KENALOG) 0.1 %, Apply 1 application topically 2 (two) times daily. For 1-2 weeks   TRUEPLUS LANCETS 33G MISC, 1 each by Does not apply route as directed.   Reviewed prior external information including notes and imaging from  primary care provider As well as notes that were available from care everywhere and other healthcare systems.  Past medical history, social, surgical and family history all reviewed in electronic medical record.  No pertanent information unless stated regarding to the chief complaint.   Review of Systems:  No headache, visual changes, nausea, vomiting, diarrhea, constipation, dizziness, abdominal pain, skin rash, fevers, chills, night sweats, weight loss, swollen lymph nodes, body aches, joint swelling, chest pain, shortness of breath, mood changes. POSITIVE  muscle aches  Objective  Blood pressure 120/74, pulse 83, height 5\' 7"  (1.702 m), weight 249 lb (112.9 kg), last menstrual period 05/27/2013, SpO2 95%.   General: No apparent distress alert and oriented x3 mood and affect normal, dressed appropriately.  HEENT: Pupils equal, extraocular movements intact  Respiratory: Patient's speak in full sentences and does not appear short of breath  Cardiovascular: No lower extremity edema, non tender, no erythema  Left knee exam does have some tenderness to palpation over the anterior aspect of the patella.  Seems to be on the anterior lateral aspect just near the insertion at the quadricep.  Patient seems to have significant discomfort as well noted.  Limited muscular skeletal ultrasound was performed and interpreted by Antoine Primas, M  Limited ultrasound of patient's quadricep tendon at the insertion of the patella shows that there is a calcific change noted that does go with a bone spur from the patella superiorly.  Hypoechoic changes minorly around the area. Impression: Superior spurring of the patella    Impression and Recommendations:     The above documentation has been reviewed and is accurate and complete Judi Saa, DO

## 2023-11-09 ENCOUNTER — Encounter: Payer: Self-pay | Admitting: Family Medicine

## 2023-11-09 ENCOUNTER — Ambulatory Visit: Payer: Medicare Other | Admitting: Family Medicine

## 2023-11-09 ENCOUNTER — Ambulatory Visit: Payer: Medicare Other

## 2023-11-09 ENCOUNTER — Other Ambulatory Visit: Payer: Self-pay

## 2023-11-09 VITALS — BP 120/74 | HR 83 | Ht 67.0 in | Wt 249.0 lb

## 2023-11-09 DIAGNOSIS — M25562 Pain in left knee: Secondary | ICD-10-CM

## 2023-11-09 DIAGNOSIS — M65269 Calcific tendinitis, unspecified lower leg: Secondary | ICD-10-CM | POA: Diagnosis not present

## 2023-11-09 NOTE — Assessment & Plan Note (Signed)
Calcific tendinitis of the quadricep and what appears to be spurring noted at the patella itself.  Will get x-rays to further evaluate for the amount of severity of the arthritic changes that is noted in mostly the medial compartment but not is where patient is symptomatic.  Will see if shockwave therapy would be beneficial, discussed topical anti-inflammatories or oral anti-inflammatories, discussed icing regimen and home exercises, which activities to do and which ones to avoid.  Follow-up with me again 6 to 8 weeks.

## 2023-11-09 NOTE — Patient Instructions (Addendum)
Shockwave therapy schedule at the front Xray today Do prescribed exercises at least 3x a week  Ice and arnica See you again in 7-8 weeks

## 2023-11-10 ENCOUNTER — Ambulatory Visit (INDEPENDENT_AMBULATORY_CARE_PROVIDER_SITE_OTHER): Payer: Self-pay | Admitting: Sports Medicine

## 2023-11-10 DIAGNOSIS — M25562 Pain in left knee: Secondary | ICD-10-CM

## 2023-11-10 DIAGNOSIS — M65269 Calcific tendinitis, unspecified lower leg: Secondary | ICD-10-CM

## 2023-11-10 NOTE — Progress Notes (Signed)
   Richardean Sale [facility] [address] [place] [address] Phone: [phone]   Extracorporeal Shockwave Therapy Note    Patient is being treated today with ECSWT. Informed consent was obtained and patient tolerated procedure well.   Therapy performed by Richardean Sale  Condition treated: Quadriceps tendinitis, calcific patellar tendinitis Treatment preset used: Acute patellar tendinitis Energy used: 90 mJ Frequency used: 10 hz Number of pulses: 2000 Head Size: Large Treatment #1 of #3  Electronically signed by:  [facility] 1:50 PM 11/10/23

## 2023-11-15 ENCOUNTER — Ambulatory Visit (INDEPENDENT_AMBULATORY_CARE_PROVIDER_SITE_OTHER): Payer: Self-pay | Admitting: Sports Medicine

## 2023-11-15 VITALS — Ht 67.0 in

## 2023-11-15 DIAGNOSIS — M25562 Pain in left knee: Secondary | ICD-10-CM

## 2023-11-15 DIAGNOSIS — M65269 Calcific tendinitis, unspecified lower leg: Secondary | ICD-10-CM

## 2023-11-15 NOTE — Progress Notes (Signed)
   Cynthia Kemp [facility] [address] [place] [address] Phone: [phone]   Extracorporeal Shockwave Therapy Note    Patient is being treated today with ECSWT. Informed consent was obtained and patient tolerated procedure well.  Patient felt significant relief after 1 day from prior ECSWT treatment on 11/10/2023, and pain has not returned.  Therapy performed by Cynthia Kemp  Condition treated: Quadriceps tendinitis, calcific patellar tendinitis Treatment preset used: Acute patellar tendinitis Energy used: 90 mJ Frequency used: 10 hz Number of pulses: 2000 Head Size: Large Treatment #2 of #3  Electronically signed by:  [facility] 8:50 AM 11/15/23

## 2023-11-21 ENCOUNTER — Ambulatory Visit (INDEPENDENT_AMBULATORY_CARE_PROVIDER_SITE_OTHER): Payer: Medicare Other | Admitting: Sports Medicine

## 2023-11-21 VITALS — BP 120/72 | HR 65 | Ht 67.0 in | Wt 249.0 lb

## 2023-11-21 DIAGNOSIS — M25562 Pain in left knee: Secondary | ICD-10-CM | POA: Diagnosis not present

## 2023-11-21 DIAGNOSIS — M65269 Calcific tendinitis, unspecified lower leg: Secondary | ICD-10-CM

## 2023-11-21 DIAGNOSIS — M1712 Unilateral primary osteoarthritis, left knee: Secondary | ICD-10-CM

## 2023-11-21 NOTE — Patient Instructions (Signed)
Knee HEP Tylenol [phone] mg 2-3 times a day for pain relief  2 week follow up

## 2023-11-21 NOTE — Progress Notes (Signed)
Cynthia Kemp [facility] [address] [place] [address] Phone: [phone]   Assessment and Plan:     1. Primary osteoarthritis of left knee 2. Calcific tendinitis of knee 3. Acute pain of left knee - Chronic with exacerbation, subsequent sports medicine visit - Continued and worsening left knee pain, primarily at night.  Patient's pain appears to be most consistent with flare of intra-articular arthritis based on physical exam, HPI, x-ray imaging. - Prior to today's visit, patient was having more anterior knee pain consistent with calcific patellar and quadriceps tendinitis and was having some benefit with shockwave therapy to these areas, however since previous shockwave treatment, patient's pain has become more global and "deep" at night - We decided to not proceed with shockwave therapy at today's visit and change visit to an office visit to further discussion options. - Patient elected to proceed with intra-articular CSI.  Tolerated well per note below.  Corticosteroid may temporarily increase blood glucose in patient with past medical history of DM type II - Start Tylenol 500 to 1000 mg tablets 2-3 times a day for day-to-day pain relief - Start HEP for knee  15 additional minutes spent for educating Therapeutic Home Exercise Program.  This included exercises focusing on stretching, strengthening, with focus on eccentric aspects.   Long term goals include an improvement in range of motion, strength, endurance as well as avoiding reinjury. Patient's frequency would include in 1-2 times a day, 3-5 times a week for a duration of 6-12 weeks. Proper technique shown and discussed handout in great detail with ATC.  All questions were discussed and answered.  Procedure: Knee Joint Injection Side: Left Indication: Flare of osteoarthritis  Risks explained and consent was given verbally. The site was cleaned with alcohol prep. A needle was introduced  with an anterio-lateral approach. Injection given using 2mL of 1% lidocaine without epinephrine and 1mL of kenalog 40mg /ml. This was well tolerated and resulted in symptomatic relief.  Needle was removed, hemostasis achieved, and post injection instructions were explained.   Pt was advised to call or return to clinic if these symptoms worsen or fail to improve as anticipated.    Pertinent previous records reviewed include reviewed left knee x-ray with patient from 11/09/2023  Follow Up: 2 weeks for reevaluation.  If no improvement or worsening of symptoms, could consider alternative injections versus physical therapy   Subjective:   I, Cynthia Kemp, am serving as a Neurosurgeon for Doctor Richardean Sale  Chief Complaint: left knee pain   HPI:   11/21/23 Patient is a 39 female with left knee pain. Patient states knee pain for a couple of weeks maybe 3. No MOI. Does endorse antalgic gait. Tylenol for the pain and it doesn't help. She isnt able to get comfortable. She notes her knee is hot. Hx of arthritis and bone spurs in the left leg. Pain is worse at night when she is trying to sleep   Relevant Historical Information: DM type II, hypertension, pulmonary fibrosis  Additional pertinent review of systems negative.   Current Outpatient Medications:    aspirin 81 MG tablet, Take 81 mg by mouth daily., Disp: , Rfl:    Blood Glucose Monitoring Suppl (ONETOUCH VERIO FLEX SYSTEM) w/Device KIT, USE AS DIRECTED ONCE A DAY, Disp: , Rfl:    clobetasol (OLUX) 0.05 % topical foam, Apply topically daily. Not On Face or Folds, Disp: 50 g, Rfl: 1   glucose blood (TRUETEST TEST) test strip, And lancets  1/day, Disp: 100 each, Rfl: 3   hydrochlorothiazide (HYDRODIURIL) 25 MG tablet, Take 25 mg by mouth daily., Disp: , Rfl: 1   KLOR-CON M20 20 MEQ tablet, Take 20 mEq by mouth daily., Disp: , Rfl: 1   metFORMIN (GLUCOPHAGE) 1000 MG tablet, Take 1,000 mg by mouth 2 (two) times daily with a meal., Disp: , Rfl:  1   rosuvastatin (CRESTOR) 10 MG tablet, Take 1 tablet (10 mg total) by mouth daily., Disp: 90 tablet, Rfl: 3   triamcinolone cream (KENALOG) 0.1 %, Apply 1 application topically 2 (two) times daily. For 1-2 weeks, Disp: , Rfl: 0   TRUEPLUS LANCETS 33G MISC, 1 each by Does not apply route as directed., Disp: 100 each, Rfl: 3   valsartan (DIOVAN) 160 MG tablet, Take 160 mg by mouth daily., Disp: , Rfl:    Objective:     Vitals:   11/21/23 0933  BP: 120/72  Pulse: 65  SpO2: 99%  Weight: 249 lb (112.9 kg)  Height: 5\' 7"  (1.702 m)      Body mass index is 39 kg/m.    Physical Exam:    General:  awake, alert oriented, no acute distress nontoxic Skin: no suspicious lesions or rashes Neuro:sensation intact and strength 5/5 with no deficits, no atrophy, normal muscle tone Psych: No signs of anxiety, depression or other mood disorder  Left knee: No swelling No deformity Neg fluid wave, joint milking ROM Flex 110, Ext 0 TTP medial and lateral joint line, patellar tendon, quadriceps tendon, posterior fossa NTTP over the medial fem condyle, lat fem condyle, patella,    tibial tuberostiy, fibular head , pes anserine bursa, gerdy's tubercle,    Gait antalgic, favoring right leg   Electronically signed by:  Cynthia Kemp [facility] 9:49 AM 11/21/23

## 2023-11-22 ENCOUNTER — Ambulatory Visit: Payer: Medicare Other | Admitting: Sports Medicine

## 2023-12-04 NOTE — Progress Notes (Signed)
Aleen Sells [facility] [address] [place] [address] Phone: [phone]   Assessment and Plan:     1. Primary osteoarthritis of left knee 2. Acute pain of left knee  -Chronic with exacerbation, subsequent visit - Significant improvement after intra-articular CSI previous office visit on 11/21/2023.  This is most consistent with resolved flare of osteoarthritis - May continue Tylenol as needed for day-to-day pain relief - Continue HEP  Pertinent previous records reviewed include none  Follow Up: As needed   Subjective:   I, Jerene Canny, am serving as a Neurosurgeon for Doctor Richardean Sale   Chief Complaint: left knee pain    HPI:    11/21/23 Patient is a 12 female with left knee pain. Patient states knee pain for a couple of weeks maybe 3. No MOI. Does endorse antalgic gait. Tylenol for the pain and it doesn't help. She isnt able to get comfortable. She notes her knee is hot. Hx of arthritis and bone spurs in the left leg. Pain is worse at night when she is trying to sleep   12/05/2023 Patient states that she is doing great.   Relevant Historical Information: DM type II, hypertension, pulmonary fibrosis  Additional pertinent review of systems negative.   Current Outpatient Medications:    aspirin 81 MG tablet, Take 81 mg by mouth daily., Disp: , Rfl:    Blood Glucose Monitoring Suppl (ONETOUCH VERIO FLEX SYSTEM) w/Device KIT, USE AS DIRECTED ONCE A DAY, Disp: , Rfl:    clobetasol (OLUX) 0.05 % topical foam, Apply topically daily. Not On Face or Folds, Disp: 50 g, Rfl: 1   glucose blood (TRUETEST TEST) test strip, And lancets 1/day, Disp: 100 each, Rfl: 3   hydrochlorothiazide (HYDRODIURIL) 25 MG tablet, Take 25 mg by mouth daily., Disp: , Rfl: 1   KLOR-CON M20 20 MEQ tablet, Take 20 mEq by mouth daily., Disp: , Rfl: 1   metFORMIN (GLUCOPHAGE) 1000 MG tablet, Take 1,000 mg by mouth 2 (two) times daily with a meal., Disp: ,  Rfl: 1   triamcinolone cream (KENALOG) 0.1 %, Apply 1 application topically 2 (two) times daily. For 1-2 weeks, Disp: , Rfl: 0   TRUEPLUS LANCETS 33G MISC, 1 each by Does not apply route as directed., Disp: 100 each, Rfl: 3   valsartan (DIOVAN) 160 MG tablet, Take 160 mg by mouth daily., Disp: , Rfl:    rosuvastatin (CRESTOR) 10 MG tablet, Take 1 tablet (10 mg total) by mouth daily., Disp: 90 tablet, Rfl: 3   Objective:     Vitals:   12/05/23 1103  BP: 122/74  Pulse: 68  SpO2: 98%  Weight: 253 lb (114.8 kg)  Height: 5\' 7"  (1.702 m)      Body mass index is 39.63 kg/m.    Physical Exam:    General:  awake, alert oriented, no acute distress nontoxic Skin: no suspicious lesions or rashes Neuro:sensation intact and strength 5/5 with no deficits, no atrophy, normal muscle tone Psych: No signs of anxiety, depression or other mood disorder  Left knee: No swelling No deformity Neg fluid wave, joint milking ROM Flex 110, Ext 0 TTP mildly tibial tuberosity NTTP over the quad tendon, medial fem condyle, lat fem condyle, patella, plica, patella tendon,  , fibular head, posterior fossa, pes anserine bursa, gerdy's tubercle, medial jt line, lateral jt line    Gait normal    Electronically signed by:  Aleen Sells [facility] 11:59  AM 12/05/23

## 2023-12-05 ENCOUNTER — Ambulatory Visit (INDEPENDENT_AMBULATORY_CARE_PROVIDER_SITE_OTHER): Payer: Medicare Other | Admitting: Sports Medicine

## 2023-12-05 VITALS — BP 122/74 | HR 68 | Ht 67.0 in | Wt 253.0 lb

## 2023-12-05 DIAGNOSIS — M25562 Pain in left knee: Secondary | ICD-10-CM

## 2023-12-05 DIAGNOSIS — M1712 Unilateral primary osteoarthritis, left knee: Secondary | ICD-10-CM

## 2023-12-05 NOTE — Patient Instructions (Signed)
Tylenol [phone] mg 2-3 times a day for pain relief as needed  Continue HEP  As needed follow up

## 2024-01-03 NOTE — Progress Notes (Signed)
[name] [address] [place] [address] Phone: [phone] Subjective:    I'm seeing this patient by the request  of:  Cynthia Has, MD  CC: Left knee pain follow-up  [id number]  12/05/2023 Calcific tendinitis of the quadricep and what appears to be spurring noted at the patella itself.  Will get x-rays to further evaluate for the amount of severity of the arthritic changes that is noted in mostly the medial compartment but not is where patient is symptomatic.  Will see if shockwave therapy would be beneficial, discussed topical anti-inflammatories or oral anti-inflammatories, discussed icing regimen and home exercises, which activities to do and which ones to avoid.  Follow-up with me again 6 to 8 weeks.      Update 01/04/2024 Cynthia Kemp is a 68 y.o. female coming in with complaint of L knee pain. Kemp been seeing Dr. Jean Kemp for shockwave therapy. Patient had injection in L knee on 11/21/2023. All of her pain is now gone.       Past Medical History:  Diagnosis Date   Diabetes mellitus without complication (HCC)    Hypertension    Psoriasis    Past Surgical History:  Procedure Laterality Date   ABDOMINAL HYSTERECTOMY     APPENDECTOMY     EYE SURGERY     TONSILLECTOMY     TONSILLECTOMY     TUBAL LIGATION     Social History   Socioeconomic History   Marital status: Divorced    Spouse name: Not on file   Number of children: Not on file   Years of education: Not on file   Highest education level: Not on file  Occupational History   Not on file  Tobacco Use   Smoking status: Never   Smokeless tobacco: Never  Substance and Sexual Activity   Alcohol use: No   Drug use: No   Sexual activity: Not on file  Other Topics Concern   Not on file  Social History Narrative   Not on file   Social Drivers of Health   Financial Resource Strain: Not on file  Food Insecurity: Not on file  Transportation Needs: Not on file   Physical Activity: Not on file  Stress: Not on file  Social Connections: Not on file   Allergies  Allergen Reactions   Ibuprofen     REACTION: face swells   Mushroom Extract Complex (Do Not Select)    Penicillins     unknown   Toluene [Toluol]     Facial swelling   Family History  Problem Relation Age of Onset   Breast cancer Mother    Hypertension Mother    Breast cancer Maternal Grandmother    Hypertension Brother    Asthma Other    COPD Other    Hypertension Other    Depression Other    Stroke Other    Heart disease Other     Current Outpatient Medications (Endocrine & Metabolic):    metFORMIN (GLUCOPHAGE) 1000 MG tablet, Take 1,000 mg by mouth 2 (two) times daily with a meal.  Current Outpatient Medications (Cardiovascular):    hydrochlorothiazide (HYDRODIURIL) 25 MG tablet, Take 25 mg by mouth daily.   valsartan (DIOVAN) 160 MG tablet, Take 160 mg by mouth daily.   rosuvastatin (CRESTOR) 10 MG tablet, Take 1 tablet (10 mg total) by mouth daily.   Current Outpatient Medications (Analgesics):    aspirin 81 MG tablet, Take 81 mg by mouth daily.   Current  Outpatient Medications (Other):    Blood Glucose Monitoring Suppl (ONETOUCH VERIO FLEX SYSTEM) w/Device KIT, USE AS DIRECTED ONCE A DAY   clobetasol (OLUX) 0.05 % topical foam, Apply topically daily. Not On Face or Folds   glucose blood (TRUETEST TEST) test strip, And lancets 1/day   KLOR-CON M20 20 MEQ tablet, Take 20 mEq by mouth daily.   triamcinolone cream (KENALOG) 0.1 %, Apply 1 application topically 2 (two) times daily. For 1-2 weeks   TRUEPLUS LANCETS 33G MISC, 1 each by Does not apply route as directed.   Objective  Blood pressure 112/72, pulse (!) 53, height 5\' 7"  (1.702 m), weight 250 lb (113.4 kg), last menstrual period 05/27/2013, SpO2 99%.   General: No apparent distress alert and oriented x3 mood and affect normal, dressed appropriately.  HEENT: Pupils equal, extraocular movements intact  asymmetry of the eyes noted. Respiratory: Patient's speak in full sentences and does not appear short of breath  Cardiovascular: Trace lower extremity edema, non tender, no erythema  Left knee does have some crepitus noted.  Does have some Dettinger motion lateral and is nontender on exam today.   Limited muscular skeletal ultrasound was performed and interpreted by Antoine Primas, M  Limited ultrasound shows narrowing of the medial and patellofemoral joint fairly significantly but no hypoechoic changes.   Impression and Recommendations:     The above documentation Kemp been reviewed and is accurate and complete Judi Saa, DO

## 2024-01-04 ENCOUNTER — Ambulatory Visit (INDEPENDENT_AMBULATORY_CARE_PROVIDER_SITE_OTHER): Payer: Medicare Other | Admitting: Family Medicine

## 2024-01-04 ENCOUNTER — Other Ambulatory Visit: Payer: Self-pay

## 2024-01-04 ENCOUNTER — Encounter: Payer: Self-pay | Admitting: Family Medicine

## 2024-01-04 VITALS — BP 112/72 | HR 53 | Ht 67.0 in | Wt 250.0 lb

## 2024-01-04 DIAGNOSIS — M25562 Pain in left knee: Secondary | ICD-10-CM

## 2024-01-04 DIAGNOSIS — M65269 Calcific tendinitis, unspecified lower leg: Secondary | ICD-10-CM

## 2024-01-04 NOTE — Assessment & Plan Note (Signed)
Completely resolved at this time.  Does have the underlying arthritic changes that we will need to continue to monitor but at this moment can follow-up as needed

## 2024-06-26 ENCOUNTER — Ambulatory Visit (INDEPENDENT_AMBULATORY_CARE_PROVIDER_SITE_OTHER)

## 2024-06-26 ENCOUNTER — Ambulatory Visit: Admitting: Sports Medicine

## 2024-06-26 VITALS — BP 130/80 | HR 74 | Ht 67.0 in | Wt 241.0 lb

## 2024-06-26 DIAGNOSIS — M1711 Unilateral primary osteoarthritis, right knee: Secondary | ICD-10-CM

## 2024-06-26 DIAGNOSIS — M25561 Pain in right knee: Secondary | ICD-10-CM

## 2024-06-26 NOTE — Patient Instructions (Addendum)
 Knee HEP  Tylenol  [phone] mg 2-3 times a day for pain relief  As needed follow up

## 2024-06-26 NOTE — Progress Notes (Signed)
 Ben Jakaree Pickard D.[facility] [address] [place] [address] Phone: [phone]   Assessment and Plan:     1. Acute pain of right knee (Primary) 2. Primary osteoarthritis of right knee -Chronic with exacerbation, subsequent visit - Consistent with flare of osteoarthritis - X-ray obtained in clinic.  My interpretation: No acute fracture or dislocation.  Tricompartmental osteoarthritis, most advanced in patellofemoral compartment - Patient had significant improvement with left knee CSI, so patient elects for right knee CSI at today's visit.  Tolerated well per note below.  Corticosteroid may temporarily blood glucose in patient with past medical history of DM type II - Use Tylenol  500 to 1000 mg tablets 2-3 times a day for day-to-day pain relief - Restart HEP for knee  Procedure: Knee Joint Injection Side: Right Indication: Flare of osteoarthritis  Risks explained and consent was given verbally. The site was cleaned with alcohol prep. A needle was introduced with an anterio-lateral approach. Injection given using 2mL of 1% lidocaine without epinephrine and 1mL of kenalog 40mg /ml. This was well tolerated and resulted in symptomatic relief.  Needle was removed, hemostasis achieved, and post injection instructions were explained.   Pt was advised to call or return to clinic if these symptoms worsen or fail to improve as anticipated.    Pertinent previous records reviewed include none  Follow Up: As needed if no improvement or worsening symptoms.  Could consider physical therapy versus alternative injection   Subjective:   I, Moenique Parris, am serving as a Neurosurgeon for Doctor Morene Mace  Chief Complaint: right knee pain   HPI:   06/26/24 Patient is 68 year old female with right knee pain. Patient states she twisted her knee July 4th 2025. She has been taking tylenol . She states she is warm to the touch. Pain is right on the knee cap. She  isnt able to sleep through the night. Pain with sitting, laying down, and walking. No radiating pain. Decreased ROM due to pain   Relevant Historical Information: DM type II, hypertension, pulmonary fibrosis  Additional pertinent review of systems negative.   Current Outpatient Medications:    aspirin 81 MG tablet, Take 81 mg by mouth daily., Disp: , Rfl:    Blood Glucose Monitoring Suppl (ONETOUCH VERIO FLEX SYSTEM) w/Device KIT, USE AS DIRECTED ONCE A DAY, Disp: , Rfl:    clobetasol  (OLUX ) 0.05 % topical foam, Apply topically daily. Not On Face or Folds, Disp: 50 g, Rfl: 1   glucose blood (TRUETEST TEST) test strip, And lancets 1/day, Disp: 100 each, Rfl: 3   hydrochlorothiazide  (HYDRODIURIL ) 25 MG tablet, Take 25 mg by mouth daily., Disp: , Rfl: 1   KLOR-CON M20 20 MEQ tablet, Take 20 mEq by mouth daily., Disp: , Rfl: 1   metFORMIN  (GLUCOPHAGE ) 1000 MG tablet, Take 1,000 mg by mouth 2 (two) times daily with a meal., Disp: , Rfl: 1   rosuvastatin  (CRESTOR ) 10 MG tablet, Take 1 tablet (10 mg total) by mouth daily., Disp: 90 tablet, Rfl: 3   triamcinolone  cream (KENALOG) 0.1 %, Apply 1 application topically 2 (two) times daily. For 1-2 weeks, Disp: , Rfl: 0   TRUEPLUS LANCETS 33G MISC, 1 each by Does not apply route as directed., Disp: 100 each, Rfl: 3   valsartan  (DIOVAN ) 160 MG tablet, Take 160 mg by mouth daily., Disp: , Rfl:    Objective:     Vitals:   06/26/24 0812  BP: 130/80  Pulse: 74  SpO2:  98%  Weight: 241 lb (109.3 kg)  Height: 5' 7 (1.702 m)      Body mass index is 37.75 kg/m.    Physical Exam:    General:  awake, alert oriented, no acute distress nontoxic Skin: no suspicious lesions or rashes Neuro:sensation intact and strength 5/5 with no deficits, no atrophy, normal muscle tone Psych: No signs of anxiety, depression or other mood disorder  Right knee: Mild swelling No deformity Positive fluid wave, joint milking ROM Flex 110, Ext 0 TTP patella, medial  joint line, medial femoral condyle, posterior fossa NTTP over the quad tendon,   lat fem condyle, , plica, patella tendon, tibial tuberostiy, fibular head,   pes anserine bursa, gerdy's tubercle,   lateral jt line  Gait antalgic, favoring left leg   Electronically signed by:  Odis Mace D.[facility] 8:31 AM 06/26/24

## 2024-06-27 ENCOUNTER — Ambulatory Visit: Payer: Self-pay | Admitting: Sports Medicine

## 2025-01-28 ENCOUNTER — Ambulatory Visit: Admitting: Sports Medicine
